# Patient Record
Sex: Female | Born: 1935 | Race: White | Hispanic: No | Smoking: Never smoker
Health system: Southern US, Community
[De-identification: ages and names within clinical notes are randomized; demographics above are authoritative.]

## PROBLEM LIST (undated history)

## (undated) DIAGNOSIS — G8929 Other chronic pain: Secondary | ICD-10-CM

## (undated) DIAGNOSIS — I10 Essential (primary) hypertension: Secondary | ICD-10-CM

## (undated) DIAGNOSIS — C50919 Malignant neoplasm of unspecified site of unspecified female breast: Secondary | ICD-10-CM

## (undated) DIAGNOSIS — M549 Dorsalgia, unspecified: Secondary | ICD-10-CM

## (undated) DIAGNOSIS — E119 Type 2 diabetes mellitus without complications: Secondary | ICD-10-CM

## (undated) DIAGNOSIS — M109 Gout, unspecified: Secondary | ICD-10-CM

## (undated) DIAGNOSIS — Z923 Personal history of irradiation: Secondary | ICD-10-CM

## (undated) DIAGNOSIS — M1990 Unspecified osteoarthritis, unspecified site: Secondary | ICD-10-CM

## (undated) HISTORY — DX: Gout, unspecified: M10.9

## (undated) HISTORY — DX: Type 2 diabetes mellitus without complications: E11.9

## (undated) HISTORY — DX: Malignant neoplasm of unspecified site of unspecified female breast: C50.919

## (undated) HISTORY — DX: Other chronic pain: G89.29

## (undated) HISTORY — PX: ABDOMINAL HYSTERECTOMY: SHX81

## (undated) HISTORY — PX: KNEE ARTHROSCOPY: SUR90

## (undated) HISTORY — PX: BREAST BIOPSY: SHX20

## (undated) HISTORY — DX: Unspecified osteoarthritis, unspecified site: M19.90

## (undated) HISTORY — PX: APPENDECTOMY: SHX54

## (undated) HISTORY — PX: CHOLECYSTECTOMY: SHX55

## (undated) HISTORY — DX: Essential (primary) hypertension: I10

## (undated) HISTORY — DX: Dorsalgia, unspecified: M54.9

---

## 2004-05-12 ENCOUNTER — Ambulatory Visit: Payer: Self-pay | Admitting: Family Medicine

## 2005-09-29 ENCOUNTER — Ambulatory Visit: Payer: Self-pay | Admitting: Family Medicine

## 2006-11-28 ENCOUNTER — Ambulatory Visit: Payer: Self-pay | Admitting: Family Medicine

## 2008-04-30 ENCOUNTER — Ambulatory Visit: Payer: Self-pay | Admitting: Family Medicine

## 2009-09-28 ENCOUNTER — Ambulatory Visit: Payer: Self-pay | Admitting: Family Medicine

## 2010-12-27 ENCOUNTER — Ambulatory Visit: Payer: Self-pay | Admitting: Family Medicine

## 2010-12-27 ENCOUNTER — Ambulatory Visit: Payer: Self-pay

## 2011-09-19 ENCOUNTER — Ambulatory Visit: Payer: Self-pay | Admitting: Family Medicine

## 2011-09-30 ENCOUNTER — Ambulatory Visit: Payer: Self-pay | Admitting: Family Medicine

## 2011-10-30 ENCOUNTER — Ambulatory Visit: Payer: Self-pay | Admitting: Family Medicine

## 2012-06-04 ENCOUNTER — Ambulatory Visit: Payer: Self-pay | Admitting: Family Medicine

## 2012-09-16 ENCOUNTER — Ambulatory Visit: Payer: Self-pay | Admitting: Family Medicine

## 2013-05-01 DIAGNOSIS — Z923 Personal history of irradiation: Secondary | ICD-10-CM

## 2013-05-01 HISTORY — PX: BREAST BIOPSY: SHX20

## 2013-05-01 HISTORY — PX: BREAST LUMPECTOMY: SHX2

## 2013-05-01 HISTORY — DX: Personal history of irradiation: Z92.3

## 2013-11-13 ENCOUNTER — Ambulatory Visit: Payer: Self-pay | Admitting: Pain Medicine

## 2013-11-26 ENCOUNTER — Ambulatory Visit: Payer: Self-pay | Admitting: Family Medicine

## 2013-12-01 ENCOUNTER — Ambulatory Visit: Payer: Self-pay | Admitting: Family Medicine

## 2013-12-01 ENCOUNTER — Ambulatory Visit: Payer: Self-pay | Admitting: Pain Medicine

## 2013-12-08 ENCOUNTER — Ambulatory Visit: Payer: Self-pay | Admitting: Pain Medicine

## 2013-12-09 ENCOUNTER — Ambulatory Visit: Payer: Self-pay | Admitting: Family Medicine

## 2013-12-11 LAB — PATHOLOGY REPORT

## 2013-12-22 ENCOUNTER — Ambulatory Visit: Payer: Self-pay | Admitting: Surgery

## 2013-12-22 LAB — BASIC METABOLIC PANEL
Anion Gap: 6 — ABNORMAL LOW (ref 7–16)
BUN: 37 mg/dL — AB (ref 7–18)
CALCIUM: 9.9 mg/dL (ref 8.5–10.1)
Chloride: 104 mmol/L (ref 98–107)
Co2: 29 mmol/L (ref 21–32)
Creatinine: 1.1 mg/dL (ref 0.60–1.30)
EGFR (African American): 55 — ABNORMAL LOW
EGFR (Non-African Amer.): 47 — ABNORMAL LOW
GLUCOSE: 83 mg/dL (ref 65–99)
Osmolality: 285 (ref 275–301)
POTASSIUM: 4.3 mmol/L (ref 3.5–5.1)
SODIUM: 139 mmol/L (ref 136–145)

## 2013-12-26 ENCOUNTER — Ambulatory Visit: Payer: Self-pay | Admitting: Surgery

## 2014-01-06 ENCOUNTER — Ambulatory Visit: Payer: Self-pay | Admitting: Pain Medicine

## 2014-01-06 LAB — PATHOLOGY REPORT

## 2014-01-09 ENCOUNTER — Ambulatory Visit: Payer: Self-pay | Admitting: Internal Medicine

## 2014-01-13 ENCOUNTER — Ambulatory Visit: Payer: Self-pay | Admitting: Internal Medicine

## 2014-01-14 ENCOUNTER — Ambulatory Visit: Payer: Self-pay | Admitting: Pain Medicine

## 2014-01-29 ENCOUNTER — Ambulatory Visit: Payer: Self-pay | Admitting: Internal Medicine

## 2014-02-05 LAB — CBC CANCER CENTER
BASOS PCT: 0.8 %
Basophil #: 0.1 x10 3/mm (ref 0.0–0.1)
EOS PCT: 1.5 %
Eosinophil #: 0.1 x10 3/mm (ref 0.0–0.7)
HCT: 43.4 % (ref 35.0–47.0)
HGB: 14.4 g/dL (ref 12.0–16.0)
LYMPHS ABS: 2.9 x10 3/mm (ref 1.0–3.6)
Lymphocyte %: 32.6 %
MCH: 28.2 pg (ref 26.0–34.0)
MCHC: 33.1 g/dL (ref 32.0–36.0)
MCV: 85 fL (ref 80–100)
MONOS PCT: 8.2 %
Monocyte #: 0.7 x10 3/mm (ref 0.2–0.9)
NEUTROS PCT: 56.9 %
Neutrophil #: 5.2 x10 3/mm (ref 1.4–6.5)
Platelet: 215 x10 3/mm (ref 150–440)
RBC: 5.09 10*6/uL (ref 3.80–5.20)
RDW: 14.5 % (ref 11.5–14.5)
WBC: 9 x10 3/mm (ref 3.6–11.0)

## 2014-02-10 ENCOUNTER — Ambulatory Visit: Payer: Self-pay | Admitting: Pain Medicine

## 2014-02-12 LAB — CBC CANCER CENTER
Basophil #: 0.1 x10 3/mm (ref 0.0–0.1)
Basophil %: 0.7 %
Eosinophil #: 0.2 x10 3/mm (ref 0.0–0.7)
Eosinophil %: 2 %
HCT: 43.5 % (ref 35.0–47.0)
HGB: 14.3 g/dL (ref 12.0–16.0)
LYMPHS ABS: 2.5 x10 3/mm (ref 1.0–3.6)
Lymphocyte %: 28.5 %
MCH: 28.2 pg (ref 26.0–34.0)
MCHC: 32.8 g/dL (ref 32.0–36.0)
MCV: 86 fL (ref 80–100)
Monocyte #: 0.8 x10 3/mm (ref 0.2–0.9)
Monocyte %: 9.5 %
NEUTROS ABS: 5.1 x10 3/mm (ref 1.4–6.5)
Neutrophil %: 59.3 %
PLATELETS: 225 x10 3/mm (ref 150–440)
RBC: 5.06 10*6/uL (ref 3.80–5.20)
RDW: 15.2 % — AB (ref 11.5–14.5)
WBC: 8.6 x10 3/mm (ref 3.6–11.0)

## 2014-02-18 ENCOUNTER — Ambulatory Visit: Payer: Self-pay | Admitting: Pain Medicine

## 2014-02-19 LAB — CBC CANCER CENTER
BASOS ABS: 0.1 x10 3/mm (ref 0.0–0.1)
Basophil %: 0.7 %
EOS ABS: 0 x10 3/mm (ref 0.0–0.7)
Eosinophil %: 0.1 %
HCT: 42.9 % (ref 35.0–47.0)
HGB: 14.1 g/dL (ref 12.0–16.0)
LYMPHS ABS: 1.7 x10 3/mm (ref 1.0–3.6)
LYMPHS PCT: 11.6 %
MCH: 28.2 pg (ref 26.0–34.0)
MCHC: 32.8 g/dL (ref 32.0–36.0)
MCV: 86 fL (ref 80–100)
MONOS PCT: 6 %
Monocyte #: 0.9 x10 3/mm (ref 0.2–0.9)
NEUTROS PCT: 81.6 %
Neutrophil #: 12.1 x10 3/mm — ABNORMAL HIGH (ref 1.4–6.5)
PLATELETS: 229 x10 3/mm (ref 150–440)
RBC: 4.99 10*6/uL (ref 3.80–5.20)
RDW: 14.9 % — AB (ref 11.5–14.5)
WBC: 14.8 x10 3/mm — ABNORMAL HIGH (ref 3.6–11.0)

## 2014-02-26 LAB — CBC CANCER CENTER
BASOS PCT: 1.1 %
Basophil #: 0.1 x10 3/mm (ref 0.0–0.1)
Eosinophil #: 0.1 x10 3/mm (ref 0.0–0.7)
Eosinophil %: 0.6 %
HCT: 45.5 % (ref 35.0–47.0)
HGB: 14.8 g/dL (ref 12.0–16.0)
Lymphocyte #: 2.4 x10 3/mm (ref 1.0–3.6)
Lymphocyte %: 20.1 %
MCH: 28.2 pg (ref 26.0–34.0)
MCHC: 32.5 g/dL (ref 32.0–36.0)
MCV: 87 fL (ref 80–100)
MONO ABS: 1 x10 3/mm — AB (ref 0.2–0.9)
Monocyte %: 8.2 %
Neutrophil #: 8.3 x10 3/mm — ABNORMAL HIGH (ref 1.4–6.5)
Neutrophil %: 70 %
PLATELETS: 237 x10 3/mm (ref 150–440)
RBC: 5.24 10*6/uL — AB (ref 3.80–5.20)
RDW: 15.5 % — ABNORMAL HIGH (ref 11.5–14.5)
WBC: 11.9 x10 3/mm — ABNORMAL HIGH (ref 3.6–11.0)

## 2014-03-01 ENCOUNTER — Ambulatory Visit: Payer: Self-pay | Admitting: Internal Medicine

## 2014-03-17 ENCOUNTER — Ambulatory Visit: Payer: Self-pay | Admitting: Pain Medicine

## 2014-03-31 ENCOUNTER — Ambulatory Visit: Payer: Self-pay | Admitting: Internal Medicine

## 2014-04-06 ENCOUNTER — Ambulatory Visit: Payer: Self-pay | Admitting: Pain Medicine

## 2014-05-12 ENCOUNTER — Ambulatory Visit: Payer: Self-pay | Admitting: Pain Medicine

## 2014-05-18 ENCOUNTER — Ambulatory Visit: Payer: Self-pay | Admitting: Pain Medicine

## 2014-07-07 ENCOUNTER — Ambulatory Visit
Admit: 2014-07-07 | Disposition: A | Discharge status: Home / Self Care | Payer: Self-pay | Attending: Internal Medicine | Admitting: Internal Medicine

## 2014-07-21 ENCOUNTER — Ambulatory Visit: Payer: Self-pay | Admitting: Pain Medicine

## 2014-07-31 ENCOUNTER — Ambulatory Visit
Admit: 2014-07-31 | Disposition: A | Discharge status: Home / Self Care | Payer: Self-pay | Attending: Internal Medicine | Admitting: Internal Medicine

## 2014-08-03 ENCOUNTER — Ambulatory Visit
Admit: 2014-08-03 | Disposition: A | Discharge status: Home / Self Care | Payer: Self-pay | Attending: Pain Medicine | Admitting: Pain Medicine

## 2014-08-22 NOTE — Consult Note (Signed)
Reason for Visit: This 81 year old Female patient presents to the clinic for initial evaluation of  breast cancer .   Referred by Dr. Tamala Julian.  Diagnosis:  Chief Complaint/Diagnosis   81 year old female with stage I (T1 A. N0 M0) invasive mammary carcinoma of the right breast status post wide local excision with focal positive margin for ductal carcinoma in situ. Tumor is ER/PR positive  Pathology Report pathology report reviewed   Imaging Report mammograms reviewed   Referral Report clinical notes reviewed   Planned Treatment Regimen hypofractionated whole breast radiation plus aromatase inhibitor   HPI   patient is an 81 year old female who presents with an abnormal mammogram of her right breast showing a 9 mm oval mass within the upper inner right breast beneath the nipple Arial her complex. Ultrasound confirmed an 8 x 7 x 6 mm lesion suspicious for malignancy. Ultrasound-guided core biopsy was positive for invasive mammary carcinoma.she underwent a wide local excision showed residual 1 mm of invasive mammary carcinoma. Tumor was ER PR positive grade 1 overall. Margins were clear 2 mm for the invasive component focal positive for ductal carcinoma in situ component. She's been seen by medical oncology and will be placed on aromatase inhibitor after completion of radiation. She is doing well at the present time specifically denies breast tenderness cough or bone pain.  Past Hx:    arthritis:    Arthritis:    Diabetes Mellitus, Type II (NIDD):    Hypertension:    Other, see comments: skin cancer removal from face   Appendectomy:    Cholecystectomy:    Hysterectomy - Total:   Past, Family and Social History:  Past Medical History positive   Cardiovascular hypertension   Endocrine diabetes mellitus   Past Surgical History appendectomy; cholecystectomy; total, hysterectomy   Past Medical History Comments arthritis, gout, chronic back pain receiving pain clinic shots at  this point   Family History positive   Family History Comments other with liver cancer father with coronary vascular disease and stroke paternal aunt with breast cancer   Social History noncontributory   Additional Past Medical and Surgical History accompanied by son and nurse navigator to   Allergies:   Neurontin: Itching  Home Meds:  Home Medications: Medication Instructions Status  CEFTIN  250  MG       1  TAB  PO  BID Active  atenolol 100 mg oral tablet 1 tab(s) orally once a day (at bedtime) Active  CeleBREX 200 mg oral capsule 1 tab(s) orally once a day (at bedtime) Active  hydrochlorothiazide-lisinopril 25 mg-20 mg oral tablet 1 tab(s) orally once a day (at bedtime) Active  metFORMIN 500 mg oral tablet 1 tab(s) orally once a day (at bedtime) Active  NIFEdipine 30 mg oral tablet, extended release 1 tab(s) orally once a day (at bedtime) Active  Norco 325 mg-5 mg oral tablet 1 tab(s) orally 2 times a day, As Needed Active  oxybutynin 5 mg oral tablet 1 tab(s) orally 2 times a day Active  PROzac 20 mg oral capsule 1 cap(s) orally once a day (at bedtime) Active  allopurinol 100 mg oral tablet 1 tab(s) orally once a day (at bedtime) Active  colchicine 0.6 mg oral tablet  orally , As Needed Active  Cormax 0.05% topical solution Apply topically to affected area 2 times a day, As Needed Active   Review of Systems:  General negative   Performance Status (ECOG) 0   Skin negative   Breast see HPI  Ophthalmologic negative   ENMT negative   Respiratory and Thorax negative   Cardiovascular negative   Gastrointestinal negative   Genitourinary negative   Musculoskeletal negative   Neurological negative   Psychiatric negative   Hematology/Lymphatics negative   Endocrine negative   Allergic/Immunologic negative   Review of Systems   except for chronic back pain which is being controlled with by the pain clinic denies any weight loss, fatigue, weakness, fever,  chills or night sweats. Patient denies any loss of vision, blurred vision. Patient denies any ringing  of the ears or hearing loss. No irregular heartbeat. Patient denies heart murmur or history of fainting. Patient denies any chest pain or pain radiating to her upper extremities. Patient denies any shortness of breath, difficulty breathing at night, cough or hemoptysis. Patient denies any swelling in the lower legs. Patient denies any nausea vomiting, vomiting of blood, or coffee ground material in the vomitus. Patient denies any stomach pain. Patient states has had normal bowel movements no significant constipation or diarrhea. Patient denies any dysuria, hematuria or significant nocturia. Patient denies any problems walking, swelling in the joints or loss of balance. Patient denies any skin changes, loss of hair or loss of weight. Patient denies any excessive worrying or anxiety or significant depression. Patient denies any problems with insomnia. Patient denies excessive thirst, polyuria, polydipsia. Patient denies any swollen glands, patient denies easy bruising or easy bleeding. Patient denies any recent infections, allergies or URI. Patient "s visual fields have not changed significantly in recent time.   Nursing Notes:  Nursing Vital Signs and Chemo Nursing Nursing Notes: *CC Vital Signs Flowsheet:   18-Sep-15 09:09  Temp Temperature 96.8  Pulse Pulse 61  Respirations Respirations 21  SBP SBP 138  DBP DBP 81  Current Weight (kg) (kg) 79.5  Height (cm) centimeters 153.2  BSA (m2) 1.7   Physical Exam:  General/Skin/HEENT:  General normal   Skin normal   Eyes normal   ENMT normal   Head and Neck normal   Additional PE well-developed female in NAD. Lungs are clear to A&P cardiac examination shows regular rate and rhythm. Right breast shows a recent wide local excision scar around the right breast nipple Arial her complex. Incision is well-healed. No dominant mass or nodularity is  noted in either breast in 2 positions examined. No axillary or supraclavicular adenopathy is appreciated.   Breasts/Resp/CV/GI/GU:  Respiratory and Thorax normal   Cardiovascular normal   Gastrointestinal normal   Genitourinary normal   MS/Neuro/Psych/Lymph:  Musculoskeletal normal   Neurological normal   Lymphatics normal   Other Results:  Radiology Results: LabUnknown:    03-Aug-15 11:09, Digital Additional Views Rt Breast (SCR)  PACS Image     03-Aug-15 11:22, MAM [place] Right Limited  PACS [place]:    03-Aug-15 11:09, Digital Additional Views Rt Breast (SCR)  Digital Additional Views Rt Breast (SCR)   REASON FOR EXAM:    av rt mass  COMMENTS:       PROCEDURE: MAM - MAM DIG ADDVIEWS RT SCR  - Dec 01 2013 11:09AM     CLINICAL DATA:  Patient recalled from screening for right breast  mass.    EXAM:  DIGITAL DIAGNOSTIC  RIGHT MAMMOGRAM WITH CAD    ULTRASOUND RIGHT BREAST    COMPARISON:  [facility]  ACR Breast Density Category b: There are scattered areas of  fibroglandular density.    FINDINGS:  Spot compression CC and MLO views demonstrate an obscured  9 mm oval  mass within the upper inner right breast middle depth.    Mammographic images were processed with CAD.    On physical exam, I palpate no discrete mass within the upper-inner  right breast.    Ultrasound is performed, showing a 8 x 7 x 6 mm taller than wide  oval mass with indistinct margins within the right breast 130  o'clock 2 cm from the nipple. No right axillary adenopathy.   IMPRESSION:  Suspicious right breast mass.    RECOMMENDATION:  Ultrasound-guided core needle biopsy.    This will be scheduled at the patient's convenience.    I have discussed the findings and recommendations with the patient.  Results were also provided in writing at the conclusion of the  visit. If applicable, a reminder letter will be sent to the patient  regarding the next appointment.    BI-RADS  CATEGORY  4: Suspicious.  Electronically Signed    By: Lovey Newcomer M.D.    On: 12/01/2013 11:27         Verified By: Ilsa Iha, M.D.,   Relevent Results:   Relevant Scans and Labs mammograms and ultrasound reviewed   Assessment and Plan: Impression:   stage I invasive mammary carcinoma right breast status post wide local excision in 81 year old female tumor is ER/PR positive for adjuvant whole breast radiation plus or an aromataseinhibitor Plan:   at this time I recommend whole breast radiation to her right breast. I would like you to hypofractionated course of treatment over 16 fractions. Also posterior scar based on the close margin another 1600 cGy using electron beam. Risks and benefits of treatment including skin reaction, fatigue, inclusion of possible superficial lung, alteration of blood counts, all were explained in detail to the patient and her son. I have set her up and ordered CT simulation for early next week. Patient will also be a candidate for aromatase inhibitor therapy after completion of radiation.  I would like to take this opportunity for allowing me to participate in the care of your patient..  Fax to Physician:  Physicians To Recieve Fax: Earlene Plater, MD - [phone] Rochel Brome - [phone].  Electronic Signatures: Jameya Pontiff, Roda Shutters (MD)  (Signed 18-Sep-15 11:37)  Authored: HPI, Diagnosis, Past Hx, PFSH, Allergies, Home Meds, ROS, Nursing Notes, Physical Exam, Other Results, Relevent Results, Encounter Assessment and Plan, Fax to Physician   Last Updated: 18-Sep-15 11:37 by Armstead Peaks (MD)

## 2014-08-22 NOTE — Op Note (Signed)
PATIENT NAME:  [name], Isabella Cochran MR#:  [id number] DATE OF BIRTH:  June 01, 1930  DATE OF PROCEDURE:  12/26/2013  PREOPERATIVE DIAGNOSIS: Right breast mass.   POSTOPERATIVE DIAGNOSIS: Right breast mass.   PROCEDURE: Excision, right breast mass.   SURGEON: Rochel Brome, M.D.   ANESTHESIA: General.   INDICATIONS: This 81 year old female recently had a mammogram depicting a density in the upper inner quadrant of the right breast. Ultrasound demonstrated an 8 mm nodule. Needle biopsy demonstrated atypical papillary neoplasm with sclerosis. Excision of the mass was recommended for further evaluation.   DESCRIPTION OF PROCEDURE: The patient was placed on the operating table in the supine position under general anesthesia. The dressing was removed from the upper inner quadrant of the right breast, exposing the Kopans wire which entered the breast several centimeters from the border of the areola. Mammogram images were viewed, seeing the location of the thick portion of the wire adjacent to the nodule and the biopsy marker. The wire was cut 2 cm from the skin. The breast was prepared with ChloraPrep, draped in a sterile manner.   A curvilinear incision was made from approximately 11:30 to the 2 o'clock position of the left breast, just about 5 mm outside the border of the areola, carried down through subcutaneous tissues. Several small bleeding points were cauterized. The wire was encountered. There was 1 arterial bleeding point which was suture ligated with 4-0 chromic and resolved. The mass was excised by removing tissue surrounding the thick portion of the wire, extending down as far as the barb. The inferior portion of the specimen was tagged with a 3-0 nylon stitch for the pathologist's orientation, and also the barb was at the deep portion of the excision. The wire was left intact and was submitted for specimen mammogram and routine pathology. The wound was inspected. There was no visible remaining mass  within the wound. No palpable mass. Several small bleeding points were cauterized. Tissues surrounding cautery artifact were infiltrated with 0.5% Sensorcaine with epinephrine. Subcutaneous tissues were infiltrated as well. Subcutaneous tissues were approximated with 4-0 chromic. The skin was closed with running 4-0 Monocryl subcuticular suture and Dermabond. The patient tolerated surgery satisfactorily and was then prepared for transfer to the recovery room.   ____________________________ Lenna Sciara. Rochel Brome, MD jws:gb D: 12/26/2013 13:27:37 ET T: 12/26/2013 23:00:15 ET JOB#: [id number]  cc: Loreli Dollar, MD, <Dictator> Loreli Dollar MD ELECTRONICALLY SIGNED 12/31/2013 18:23

## 2014-08-26 ENCOUNTER — Ambulatory Visit
Admit: 2014-08-26 | Disposition: A | Discharge status: Home / Self Care | Payer: Self-pay | Attending: Pain Medicine | Admitting: Pain Medicine

## 2014-09-01 ENCOUNTER — Ambulatory Visit: Payer: Self-pay | Admitting: Pain Medicine

## 2014-09-06 DIAGNOSIS — M5416 Radiculopathy, lumbar region: Secondary | ICD-10-CM | POA: Insufficient documentation

## 2014-09-06 DIAGNOSIS — M48061 Spinal stenosis, lumbar region without neurogenic claudication: Secondary | ICD-10-CM | POA: Insufficient documentation

## 2014-09-06 DIAGNOSIS — M5137 Other intervertebral disc degeneration, lumbosacral region: Secondary | ICD-10-CM | POA: Insufficient documentation

## 2014-09-06 DIAGNOSIS — M51379 Other intervertebral disc degeneration, lumbosacral region without mention of lumbar back pain or lower extremity pain: Secondary | ICD-10-CM | POA: Insufficient documentation

## 2014-09-06 NOTE — Patient Instructions (Addendum)
Continue present medications and take antibiotic.   F/U PCP for evaluation of BP and general medical condition.  F/U neurological evaluation.  .F/U surgical evaluation.   May consider radiofrequency rhizolysis, intraspinal implantation, and other procedures  Patient to call Pain Management Center for any concerns prior to scheduled appointment.  Patient is to call Pain Management Center should the patient have concerns prior to return appointmen Pain Management Discharge Instructions  General Discharge Instructions :  If you need to reach your doctor call: Monday-Friday 8:00 am - 4:00 pm at [phone] or toll free [phone].  After clinic hours [phone] to have operator reach doctor.  Bring all of your medication bottles to all your appointments in the pain clinic.  To cancel or reschedule your appointment with Pain Management please remember to call 24 hours in advance to avoid a fee.  Refer to the educational materials which you have been given on: General Risks, I had my Procedure. Discharge Instructions, Post Sedation.  Post Procedure Instructions:  The drugs you were given will stay in your system until tomorrow, so for the next 24 hours you should not drive, make any legal decisions or drink any alcoholic beverages.  You may eat anything you prefer, but it is better to start with liquids then soups and crackers, and gradually work up to solid foods.  Please notify your doctor immediately if you have any unusual bleeding, trouble breathing or pain that is not related to your normal pain.  Depending on the type of procedure that was done, some parts of your body may feel week and/or numb.  This usually clears up by tonight or the next day.  Walk with the use of an assistive device or accompanied by an adult for the 24 hours.  You may use ice on the affected area for the first 24 hours.  Put ice in a Ziploc bag and cover with a towel and place against area 15 minutes  on 15 minutes off.  You may switch to heat after 24 hours.

## 2014-09-07 ENCOUNTER — Ambulatory Visit: Payer: Medicare Other | Attending: Pain Medicine | Admitting: Pain Medicine

## 2014-09-07 ENCOUNTER — Encounter: Payer: Self-pay | Admitting: Pain Medicine

## 2014-09-07 VITALS — BP 111/35 | HR 64 | Temp 97.7°F | Resp 16 | Ht 60.0 in | Wt 169.0 lb

## 2014-09-07 DIAGNOSIS — M5137 Other intervertebral disc degeneration, lumbosacral region: Secondary | ICD-10-CM | POA: Insufficient documentation

## 2014-09-07 DIAGNOSIS — M4806 Spinal stenosis, lumbar region: Secondary | ICD-10-CM | POA: Diagnosis not present

## 2014-09-07 DIAGNOSIS — M5416 Radiculopathy, lumbar region: Secondary | ICD-10-CM | POA: Diagnosis not present

## 2014-09-07 DIAGNOSIS — M48061 Spinal stenosis, lumbar region without neurogenic claudication: Secondary | ICD-10-CM

## 2014-09-07 MED ORDER — FENTANYL CITRATE (PF) 100 MCG/2ML IJ SOLN
INTRAMUSCULAR | Status: AC
  Administered 2014-09-07: 50 ug via INTRAVENOUS
  Filled 2014-09-07: qty 2

## 2014-09-07 MED ORDER — BUPIVACAINE HCL (PF) 0.25 % IJ SOLN
INTRAMUSCULAR | Status: AC
  Filled 2014-09-07: qty 30

## 2014-09-07 MED ORDER — CEFUROXIME AXETIL 250 MG PO TABS
250.0000 mg | ORAL_TABLET | Freq: Two times a day (BID) | ORAL | Status: DC
Start: 2014-09-07 — End: 2015-11-04

## 2014-09-07 MED ORDER — CEFAZOLIN SODIUM 1-5 GM-% IV SOLN
1.0000 g | Freq: Once | INTRAVENOUS | Status: AC
  Filled 2014-09-07: qty 50

## 2014-09-07 MED ORDER — LIDOCAINE HCL (PF) 1 % IJ SOLN
INTRAMUSCULAR | Status: AC
  Administered 2014-09-07: 15:00:00
  Filled 2014-09-07: qty 5

## 2014-09-07 MED ORDER — SODIUM CHLORIDE 0.9 % IJ SOLN
INTRAMUSCULAR | Status: AC
  Administered 2014-09-07: 15:00:00
  Filled 2014-09-07: qty 20

## 2014-09-07 MED ORDER — TRIAMCINOLONE ACETONIDE 40 MG/ML IJ SUSP
INTRAMUSCULAR | Status: AC
  Administered 2014-09-07: 40 mg via EPIDURAL
  Filled 2014-09-07: qty 1

## 2014-09-07 MED ORDER — ORPHENADRINE CITRATE 30 MG/ML IJ SOLN
INTRAMUSCULAR | Status: AC
  Filled 2014-09-07: qty 2

## 2014-09-07 MED ORDER — MIDAZOLAM HCL 5 MG/5ML IJ SOLN
INTRAMUSCULAR | Status: AC
  Administered 2014-09-07: 2 mg via INTRAVENOUS
  Filled 2014-09-07: qty 5

## 2014-09-07 MED ORDER — CEFAZOLIN SODIUM 1 G IJ SOLR
INTRAMUSCULAR | Status: AC
  Administered 2014-09-07: 1 g via INTRAVENOUS
  Filled 2014-09-07: qty 10

## 2014-09-07 NOTE — Progress Notes (Signed)
Last time eat or drink last night. Took 2 meds (nifedipine and arimidex) this am with a sip of water. Driver is available upon calling. Is not on any blood thinners.

## 2014-09-07 NOTE — Progress Notes (Signed)
Patient is a 81 year old female returns to pain management for further evaluation and treatment of lower back and lower extremity pain with MRI evidence of degenerative changes of the lumbar spine wishes to proceed with interventional treatment in attempt to decrease severity of patient's symptoms and hopefully retard progression of symptoms. The risks benefits and expectations of procedure discussed and explained to patient who is understanding and wishes to proceed with procedure

## 2014-09-07 NOTE — Procedures (Signed)
PROCEDURE PERFORMED: Lumbar epidural steroid injection   NOTE: The patient is a 81 y.o.-year-old female who returns to [place] for further evaluation and treatment of pain involving the lumbar and lower extremity region. MRI studies have revealed the patient to be with **. The risks, benefits, and expectations of the procedure have been discussed and explained to the patient who was understanding and in agreement with suggested treatment plan. We will proceed with interventional treatment as discussed and explained to the patient who is willing to proceed with procedure as planned.   DESCRIPTION OF PROCEDURE: Lumbar epidural steroid injection with IV Versed, IV fentanyl conscious sedation, EKG, blood pressure, pulse, and pulse oximetry monitoring. The procedure was performed with the patient in the prone position under fluoroscopic guidance. A local anesthetic skin wheal of 1.5% plain lidocaine was accomplished at proposed entry site. An 18-gauge Tuohy epidural needle was inserted at the L 3 vertebral body level left  of the midline via loss-of-resistance technique with negative heme and negative CSF return. A total of 4 mL of Preservative-Free normal saline with 40 mg of Kenalog injected incrementally via epidurally placed needle. Needle removed. The patient tolerated the injection well.   PLAN:   1. Medications: We will continue presently prescribed medications. 2. Will consider modification of treatment regimen pending response to treatment rendered on today's visit and follow-up evaluation. 3. The patient is to follow-up with primary care physician regarding blood pressure and general medical condition status post lumbar epidural steroid injection performed on today's visit. 4. Surgical evaluation. 5. Neurological evaluation. 6. The patient may be a candidate for radiofrequency procedures, implantation device, and other treatment pending response to treatment and follow-up  evaluation. 7. The patient has been advised to adhere to proper body mechanics and avoid activities which appear to aggravate condition. 8. The patient has been advised to call the Pain Management Center prior to scheduled return appointment should there be significant change in condition or should there be significant  1. Medications: We will continue presently prescribed medications.  2. Will consider modification of treatment regimen pending response to treatment rendered on today's visit and follow-up evaluation.  3. The patient is to follow-up with primary care physician regarding blood pressure and general medical condition status post lumbar epidural steroid injection performed on today's visit.  4. Surgical evaluation.  5. Neurological evaluation. 6. The patient may be a candidate for radiofrequency procedures, implantation device, and other treatment pending response to treatment and follow-up evaluation.  7. The patient has been advised to adhere to proper body mechanics and avoid activities which appear to aggravate condition.  8. The patient has been advised to call the Pain Management Center prior to scheduled return appointment should there be significant change in condition or should should patient have other concerns regarding condition prior to scheduled return appointment.  The patient is understanding and in agreement with suggested treatment plan.left

## 2014-09-07 NOTE — Progress Notes (Signed)
Discharge patient home via wheelchair at  [facility]. Teach back 3 done

## 2014-09-08 ENCOUNTER — Telehealth: Payer: Self-pay | Admitting: *Deleted

## 2014-09-08 NOTE — Telephone Encounter (Signed)
No problems post procedure. 

## 2014-09-21 ENCOUNTER — Ambulatory Visit: Payer: Self-pay | Attending: Radiation Oncology | Admitting: Radiation Oncology

## 2014-10-04 ENCOUNTER — Other Ambulatory Visit: Payer: Self-pay | Admitting: Pain Medicine

## 2014-10-04 DIAGNOSIS — M5416 Radiculopathy, lumbar region: Secondary | ICD-10-CM

## 2014-10-04 DIAGNOSIS — M48061 Spinal stenosis, lumbar region without neurogenic claudication: Secondary | ICD-10-CM

## 2014-10-04 DIAGNOSIS — M5137 Other intervertebral disc degeneration, lumbosacral region: Secondary | ICD-10-CM

## 2014-10-04 DIAGNOSIS — M48062 Spinal stenosis, lumbar region with neurogenic claudication: Secondary | ICD-10-CM | POA: Insufficient documentation

## 2014-10-08 ENCOUNTER — Ambulatory Visit: Payer: Medicare Other | Admitting: Pain Medicine

## 2014-10-19 ENCOUNTER — Encounter: Payer: Self-pay | Admitting: Pain Medicine

## 2014-10-19 ENCOUNTER — Ambulatory Visit: Payer: Medicare Other | Attending: Pain Medicine | Admitting: Pain Medicine

## 2014-10-19 VITALS — BP 105/74 | HR 65 | Temp 98.3°F | Resp 18 | Ht 60.0 in | Wt 170.0 lb

## 2014-10-19 DIAGNOSIS — M5416 Radiculopathy, lumbar region: Secondary | ICD-10-CM

## 2014-10-19 DIAGNOSIS — M48061 Spinal stenosis, lumbar region without neurogenic claudication: Secondary | ICD-10-CM

## 2014-10-19 DIAGNOSIS — M1288 Other specific arthropathies, not elsewhere classified, other specified site: Secondary | ICD-10-CM | POA: Diagnosis not present

## 2014-10-19 DIAGNOSIS — M79604 Pain in right leg: Secondary | ICD-10-CM | POA: Diagnosis present

## 2014-10-19 DIAGNOSIS — M47816 Spondylosis without myelopathy or radiculopathy, lumbar region: Secondary | ICD-10-CM | POA: Insufficient documentation

## 2014-10-19 DIAGNOSIS — M5136 Other intervertebral disc degeneration, lumbar region: Secondary | ICD-10-CM | POA: Diagnosis not present

## 2014-10-19 DIAGNOSIS — M79605 Pain in left leg: Secondary | ICD-10-CM | POA: Diagnosis present

## 2014-10-19 DIAGNOSIS — M4316 Spondylolisthesis, lumbar region: Secondary | ICD-10-CM | POA: Diagnosis not present

## 2014-10-19 DIAGNOSIS — M48062 Spinal stenosis, lumbar region with neurogenic claudication: Secondary | ICD-10-CM

## 2014-10-19 DIAGNOSIS — M533 Sacrococcygeal disorders, not elsewhere classified: Secondary | ICD-10-CM | POA: Insufficient documentation

## 2014-10-19 DIAGNOSIS — M51379 Other intervertebral disc degeneration, lumbosacral region without mention of lumbar back pain or lower extremity pain: Secondary | ICD-10-CM

## 2014-10-19 DIAGNOSIS — M5137 Other intervertebral disc degeneration, lumbosacral region: Secondary | ICD-10-CM

## 2014-10-19 DIAGNOSIS — M545 Low back pain: Secondary | ICD-10-CM | POA: Diagnosis present

## 2014-10-19 NOTE — Progress Notes (Signed)
teachback times 3  No scripts  Discharged to home

## 2014-10-19 NOTE — Progress Notes (Signed)
Safety precautions to be maintained throughout the outpatient stay will include: orient to surroundings, keep bed in low position, maintain call bell within reach at all times, provide assistance with transfer out of bed and ambulation.  

## 2014-10-19 NOTE — Patient Instructions (Addendum)
Continue present medications.  Lumbar epidural steroid injection Monday, 10/26/2014  F/U PCP for evaliation of  BP and general medical  condition.  F/U surgical evaluation.  F/U neurological evaluation.  May consider radiofrequency rhizolysis or intraspinal procedures pending response to present treatment and F/U evaluation.  Patient to call [facility] should patient have concerns prior to scheduled return appointment. Epidural Steroid Injection Patient Information  Description: The epidural space surrounds the nerves as they exit the spinal cord.  In some patients, the nerves can be compressed and inflamed by a bulging disc or a tight spinal canal (spinal stenosis).  By injecting steroids into the epidural space, we can bring irritated nerves into direct contact with a potentially helpful medication.  These steroids act directly on the irritated nerves and can reduce swelling and inflammation which often leads to decreased pain.  Epidural steroids may be injected anywhere along the spine and from the neck to the low back depending upon the location of your pain.   After numbing the skin with local anesthetic (like Novocaine), a small needle is passed into the epidural space slowly.  You may experience a sensation of pressure while this is being done.  The entire block usually last less than 10 minutes.  Conditions which may be treated by epidural steroids:   Low back and leg pain  Neck and arm pain  Spinal stenosis  Post-laminectomy syndrome  Herpes zoster (shingles) pain  Pain from compression fractures  Preparation for the injection:  1. Do not eat any solid food or dairy products within 6 hours of your appointment.  2. You may drink clear liquids up to 2 hours before appointment.  Clear liquids include water, black coffee, juice or soda.  No milk or cream please. 3. You may take your regular medication, including pain medications, with a sip of water before your  appointment  Diabetics should hold regular insulin (if taken separately) and take 1/2 normal NPH dos the morning of the procedure.  Carry some sugar containing items with you to your appointment. 4. A driver must accompany you and be prepared to drive you home after your procedure.  5. Bring all your current medications with your. 6. An IV may be inserted and sedation may be given at the discretion of the physician.   7. A blood pressure cuff, EKG and other monitors will often be applied during the procedure.  Some patients may need to have extra oxygen administered for a short period. 8. You will be asked to provide medical information, including your allergies, prior to the procedure.  We must know immediately if you are taking blood thinners (like Coumadin/Warfarin)  Or if you are allergic to IV iodine contrast (dye). We must know if you could possible be pregnant.  Possible side-effects:  Bleeding from needle site  Infection (rare, may require surgery)  Nerve injury (rare)  Numbness & tingling (temporary)  Difficulty urinating (rare, temporary)  Spinal headache ( a headache worse with upright posture)  Light -headedness (temporary)  Pain at injection site (several days)  Decreased blood pressure (temporary)  Weakness in arm/leg (temporary)  Pressure sensation in back/neck (temporary)  Call if you experience:  Fever/chills associated with headache or increased back/neck pain.  Headache worsened by an upright position.  New onset weakness or numbness of an extremity below the injection site  Hives or difficulty breathing (go to the emergency room)  Inflammation or drainage at the infection site  Severe back/neck pain  Any new  symptoms which are concerning to you  Please note:  Although the local anesthetic injected can often make your back or neck feel good for several hours after the injection, the pain will likely return.  It takes 3-7 days for steroids to work in  the epidural space.  You may not notice any pain relief for at least that one week.  If effective, we will often do a series of three injections spaced 3-6 weeks apart to maximally decrease your pain.  After the initial series, we generally will wait several months before considering a repeat injection of the same type.  If you have any questions, please call [phone] [facility] Information  Description: The epidural space surrounds the nerves as they exit the spinal cord.  In some patients, the nerves can be compressed and inflamed by a bulging disc or a tight spinal canal (spinal stenosis).  By injecting steroids into the epidural space, we can bring irritated nerves into direct contact with a potentially helpful medication.  These steroids act directly on the irritated nerves and can reduce swelling and inflammation which often leads to decreased pain.  Epidural steroids may be injected anywhere along the spine and from the neck to the low back depending upon the location of your pain.   After numbing the skin with local anesthetic (like Novocaine), a small needle is passed into the epidural space slowly.  You may experience a sensation of pressure while this is being done.  The entire block usually last less than 10 minutes.  Conditions which may be treated by epidural steroids:   Low back and leg pain  Neck and arm pain  Spinal stenosis  Post-laminectomy syndrome  Herpes zoster (shingles) pain  Pain from compression fractures  Preparation for the injection:  9. Do not eat any solid food or dairy products within 6 hours of your appointment.  10. You may drink clear liquids up to 2 hours before appointment.  Clear liquids include water, black coffee, juice or soda.  No milk or cream please. 11. You may take your regular medication, including pain medications, with a sip of water before your appointment   Diabetics should hold regular insulin (if taken separately) and take 1/2 normal NPH dos the morning of the procedure.  Carry some sugar containing items with you to your appointment. 12. A driver must accompany you and be prepared to drive you home after your procedure.  71. Bring all your current medications with your. 14. An IV may be inserted and sedation may be given at the discretion of the physician.   15. A blood pressure cuff, EKG and other monitors will often be applied during the procedure.  Some patients may need to have extra oxygen administered for a short period. 80. You will be asked to provide medical information, including your allergies, prior to the procedure.  We must know immediately if you are taking blood thinners (like Coumadin/Warfarin)  Or if you are allergic to IV iodine contrast (dye). We must know if you could possible be pregnant.  Possible side-effects:  Bleeding from needle site  Infection (rare, may require surgery)  Nerve injury (rare)  Numbness & tingling (temporary)  Difficulty urinating (rare, temporary)  Spinal headache ( a headache worse with upright posture)  Light -headedness (temporary)  Pain at injection site (several days)  Decreased blood pressure (temporary)  Weakness in arm/leg (temporary)  Pressure sensation in back/neck (temporary)  Call if  you experience:  Fever/chills associated with headache or increased back/neck pain.  Headache worsened by an upright position.  New onset weakness or numbness of an extremity below the injection site  Hives or difficulty breathing (go to the emergency room)  Inflammation or drainage at the infection site  Severe back/neck pain  Any new symptoms which are concerning to you  Please note:  Although the local anesthetic injected can often make your back or neck feel good for several hours after the injection, the pain will likely return.  It takes 3-7 days for steroids to work in the  epidural space.  You may not notice any pain relief for at least that one week.  If effective, we will often do a series of three injections spaced 3-6 weeks apart to maximally decrease your pain.  After the initial series, we generally will wait several months before considering a repeat injection of the same type.  If you have any questions, please call [phone] [facility]

## 2014-10-19 NOTE — Progress Notes (Signed)
   Subjective:    Patient ID: Isabella Cochran, female    DOB: 05-04-1930, 81 y.o.   MRN: [id number]  HPI  Patient is a 81 year old female who returns to [place] for further evaluation and treatment of pain involving the lower back and lower extremity regions. Patient has noted improvement of her pain with interventional treatment performed in [facility] states that she would like to proceed with procedure in attempt to see if she can decrease her symptoms even more. We discussed patient's condition patient prefers to avoid considering surgical intervention we will proceed with lumbar epidural steroid injection at time return appointment in attempt to decrease severity of patient's symptoms, minimize progression of patient's symptoms, and hopefully avoid the need for more involved treatment. The patient was understanding and agreement with suggested treatment plan.     Review of Systems     Objective:   Physical Exam  There was tenderness of the splenius capitis and occipitalis musculature region of mild degree. Mild tenderness of the cervical and thoracic facet regions. There was mild tenderness of the acromioclavicular and glenohumeral joint region. Patient appeared to be with bilaterally equal grip strength. Phalen's maneuver were without increase of pain of any significant degree. Over the thoracic facet thoracic paraspinal musculature region was with evidence of muscle spasm in the lower thoracic region. No crepitus of the thoracic region was noted. Palpation over the lumbar paraspinal musculature region lumbar facet region associated with moderate discomfort. Palpation over the PSIS and PII S region reproduced moderate discomfort. With mild tends to palpation over the greater trochanteric region iliotibial band region. Straight leg raising was tolerates approximately 30 without an increase of pain with dorsiflexion noted. There was negative clonus negative Homans.  Abdomen nontender and no costovertebral tenderness noted.      Assessment & Plan:  Degenerative disc disease lumbar spine Multilevel degenerative changes with anterior listhesis L4-L5 level with facet arthropathy with L2-L3, L4-L5 advanced lumbar spondylosis, change worse at L2-3, with bilateral facet arthropathy is concern regarding lumbar degenerative changes with neurogenic claudication contributing to patient's symptomatology of significant degree   Lumbar facet syndrome  Sacroiliac joint dysfunction     Plan   Continue present medications.  Lumbar epidural steroid injection to be performed at time of return appointment  F/U PCP for evaliation of  BP and general medical  condition.  F/U surgical evaluation.  F/U neurological evaluation.  May consider radiofrequency rhizolysis or intraspinal procedures pending response to present treatment and F/U evaluation.  Patient to call [facility] should patient have concerns prior to scheduled return appointment.

## 2014-11-09 ENCOUNTER — Ambulatory Visit: Payer: Medicare Other | Attending: Pain Medicine | Admitting: Pain Medicine

## 2014-11-09 VITALS — BP 117/63 | HR 59 | Temp 98.2°F | Resp 16 | Ht 60.0 in | Wt 170.0 lb

## 2014-11-09 DIAGNOSIS — M545 Low back pain: Secondary | ICD-10-CM | POA: Diagnosis present

## 2014-11-09 DIAGNOSIS — M1288 Other specific arthropathies, not elsewhere classified, other specified site: Secondary | ICD-10-CM | POA: Diagnosis not present

## 2014-11-09 DIAGNOSIS — M5137 Other intervertebral disc degeneration, lumbosacral region: Secondary | ICD-10-CM

## 2014-11-09 DIAGNOSIS — M4316 Spondylolisthesis, lumbar region: Secondary | ICD-10-CM | POA: Diagnosis not present

## 2014-11-09 DIAGNOSIS — M48061 Spinal stenosis, lumbar region without neurogenic claudication: Secondary | ICD-10-CM

## 2014-11-09 DIAGNOSIS — M47816 Spondylosis without myelopathy or radiculopathy, lumbar region: Secondary | ICD-10-CM | POA: Insufficient documentation

## 2014-11-09 DIAGNOSIS — M79605 Pain in left leg: Secondary | ICD-10-CM | POA: Diagnosis present

## 2014-11-09 DIAGNOSIS — M79604 Pain in right leg: Secondary | ICD-10-CM | POA: Diagnosis present

## 2014-11-09 DIAGNOSIS — M48062 Spinal stenosis, lumbar region with neurogenic claudication: Secondary | ICD-10-CM

## 2014-11-09 DIAGNOSIS — M51379 Other intervertebral disc degeneration, lumbosacral region without mention of lumbar back pain or lower extremity pain: Secondary | ICD-10-CM

## 2014-11-09 DIAGNOSIS — M5416 Radiculopathy, lumbar region: Secondary | ICD-10-CM

## 2014-11-09 MED ORDER — LIDOCAINE HCL (PF) 1 % IJ SOLN
INTRAMUSCULAR | Status: AC
Start: 2014-11-09 — End: 2014-11-09
  Administered 2014-11-09: 11:00:00
  Filled 2014-11-09: qty 5

## 2014-11-09 MED ORDER — SODIUM CHLORIDE 0.9 % IJ SOLN
INTRAMUSCULAR | Status: AC
  Administered 2014-11-09: 11:00:00
  Filled 2014-11-09: qty 20

## 2014-11-09 MED ORDER — MIDAZOLAM HCL 5 MG/5ML IJ SOLN
INTRAMUSCULAR | Status: AC
Start: 2014-11-09 — End: 2014-11-09
  Administered 2014-11-09: 2 mg via INTRAVENOUS
  Filled 2014-11-09: qty 5

## 2014-11-09 MED ORDER — BUPIVACAINE HCL (PF) 0.25 % IJ SOLN
INTRAMUSCULAR | Status: AC
  Filled 2014-11-09: qty 30

## 2014-11-09 MED ORDER — TRIAMCINOLONE ACETONIDE 40 MG/ML IJ SUSP
INTRAMUSCULAR | Status: AC
  Administered 2014-11-09: 11:00:00
  Filled 2014-11-09: qty 1

## 2014-11-09 MED ORDER — ORPHENADRINE CITRATE 30 MG/ML IJ SOLN
INTRAMUSCULAR | Status: AC
  Filled 2014-11-09: qty 2

## 2014-11-09 MED ORDER — FENTANYL CITRATE (PF) 100 MCG/2ML IJ SOLN
INTRAMUSCULAR | Status: AC
  Administered 2014-11-09: 50 ug via INTRAVENOUS
  Filled 2014-11-09: qty 2

## 2014-11-09 NOTE — Progress Notes (Signed)
Safety precautions to be maintained throughout the outpatient stay will include: orient to surroundings, keep bed in low position, maintain call bell within reach at all times, provide assistance with transfer out of bed and ambulation.  

## 2014-11-09 NOTE — Patient Instructions (Addendum)
Continue present medications  F/U PCP Dr. Iona Beard evaliation of  BP and general medical  condition.  F/U surgical evaluation  F/U neurological evaluation  May consider radiofrequency rhizolysis or intraspinal procedures pending response to present treatment and F/U evaluation.  Patient to call [facility] should patient have concerns prior to scheduled return appointment. Pain Management Discharge Instructions  General Discharge Instructions :  If you need to reach your doctor call: Monday-Friday 8:00 am - 4:00 pm at [phone] or toll free [phone].  After clinic hours [phone] to have operator reach doctor.  Bring all of your medication bottles to all your appointments in the pain clinic.  To cancel or reschedule your appointment with Pain Management please remember to call 24 hours in advance to avoid a fee.  Refer to the educational materials which you have been given on: General Risks, I had my Procedure. Discharge Instructions, Post Sedation.  Post Procedure Instructions:  The drugs you were given will stay in your system until tomorrow, so for the next 24 hours you should not drive, make any legal decisions or drink any alcoholic beverages.  You may eat anything you prefer, but it is better to start with liquids then soups and crackers, and gradually work up to solid foods.  Please notify your doctor immediately if you have any unusual bleeding, trouble breathing or pain that is not related to your normal pain.  Depending on the type of procedure that was done, some parts of your body may feel week and/or numb.  This usually clears up by tonight or the next day.  Walk with the use of an assistive device or accompanied by an adult for the 24 hours.  You may use ice on the affected area for the first 24 hours.  Put ice in a Ziploc bag and cover with a towel and place against area 15 minutes on 15 minutes off.  You may switch to heat after 24 hours.Epidural  Steroid Injection Patient Information  Description: The epidural space surrounds the nerves as they exit the spinal cord.  In some patients, the nerves can be compressed and inflamed by a bulging disc or a tight spinal canal (spinal stenosis).  By injecting steroids into the epidural space, we can bring irritated nerves into direct contact with a potentially helpful medication.  These steroids act directly on the irritated nerves and can reduce swelling and inflammation which often leads to decreased pain.  Epidural steroids may be injected anywhere along the spine and from the neck to the low back depending upon the location of your pain.   After numbing the skin with local anesthetic (like Novocaine), a small needle is passed into the epidural space slowly.  You may experience a sensation of pressure while this is being done.  The entire block usually last less than 10 minutes.  Conditions which may be treated by epidural steroids:   Low back and leg pain  Neck and arm pain  Spinal stenosis  Post-laminectomy syndrome  Herpes zoster (shingles) pain  Pain from compression fractures  Preparation for the injection:  1. Do not eat any solid food or dairy products within 6 hours of your appointment.  2. You may drink clear liquids up to 2 hours before appointment.  Clear liquids include water, black coffee, juice or soda.  No milk or cream please. 3. You may take your regular medication, including pain medications, with a sip of water before your appointment  Diabetics should hold regular insulin (  if taken separately) and take 1/2 normal NPH dos the morning of the procedure.  Carry some sugar containing items with you to your appointment. 4. A driver must accompany you and be prepared to drive you home after your procedure.  5. Bring all your current medications with your. 6. An IV may be inserted and sedation may be given at the discretion of the physician.   7. A blood pressure cuff, EKG  and other monitors will often be applied during the procedure.  Some patients may need to have extra oxygen administered for a short period. 8. You will be asked to provide medical information, including your allergies, prior to the procedure.  We must know immediately if you are taking blood thinners (like Coumadin/Warfarin)  Or if you are allergic to IV iodine contrast (dye). We must know if you could possible be pregnant.  Possible side-effects:  Bleeding from needle site  Infection (rare, may require surgery)  Nerve injury (rare)  Numbness & tingling (temporary)  Difficulty urinating (rare, temporary)  Spinal headache ( a headache worse with upright posture)  Light -headedness (temporary)  Pain at injection site (several days)  Decreased blood pressure (temporary)  Weakness in arm/leg (temporary)  Pressure sensation in back/neck (temporary)  Call if you experience:  Fever/chills associated with headache or increased back/neck pain.  Headache worsened by an upright position.  New onset weakness or numbness of an extremity below the injection site  Hives or difficulty breathing (go to the emergency room)  Inflammation or drainage at the infection site  Severe back/neck pain  Any new symptoms which are concerning to you  Please note:  Although the local anesthetic injected can often make your back or neck feel good for several hours after the injection, the pain will likely return.  It takes 3-7 days for steroids to work in the epidural space.  You may not notice any pain relief for at least that one week.  If effective, we will often do a series of three injections spaced 3-6 weeks apart to maximally decrease your pain.  After the initial series, we generally will wait several months before considering a repeat injection of the same type.  If you have any questions, please call [phone] [facility]

## 2014-11-09 NOTE — Progress Notes (Signed)
   Subjective:    Patient ID: Isabella Cochran, female    DOB: June 07, 1930, 81 y.o.   MRN: [id number]  HPI PROCEDURE PERFORMED: Lumbar epidural steroid injection   NOTE: The patient is a 81 y.o. female who returns to [place] for further evaluation and treatment of pain involving the lumbar and lower extremity region. MRI revealed the patient to be with degenerative changes lumbar spine anterolisthesis L4-L5, facet arthropathy, left-sided neural impingement L2-3 and L3-4 with advanced lumbar spondylosis since previous study. L2-3 bilateral facet arthropathy. The risks, benefits, and expectations of the procedure have been discussed and explained to the patient who was understanding and in agreement with suggested treatment plan. We will proceed with lumbar epidural steroid injection as discussed and as explained to the patient who is willing to proceed with procedure as planned.   DESCRIPTION OF PROCEDURE: Lumbar epidural steroid injection with IV Versed, IV fentanyl conscious sedation, EKG, blood pressure, pulse, and pulse oximetry monitoring. The procedure was performed with the patient in the prone position under fluoroscopic guidance. A local anesthetic skin wheal of 1.5% plain lidocaine was accomplished at proposed entry site. An 18-gauge Tuohy epidural needle was inserted at the L 5 vertebral body level right of the midline via loss-of-resistance technique with negative heme and negative CSF return. A total of 4 mL of Preservative-Free normal saline with 40 mg of Kenalog injected incrementally via epidurally placed needle. Needle was removed.    A total of 40 mg of Kenalog was utilized for the procedure.   The patient tolerated the injection well.    PLAN:   1. Medications: We will continue presently prescribed medications. 2. Will consider modification of treatment regimen pending response to treatment rendered on today's visit and follow-up evaluation. 3. The patient is to  follow-up with primary care physician Dr. Iona Beard regarding blood pressure and general medical condition status post lumbar epidural steroid injection performed on today's visit. 4. Surgical evaluation. 5. Neurological evaluation. 6. The patient may be a candidate for radiofrequency procedures, implantation device, and other treatment pending response to treatment and follow-up evaluation. 7. The patient has been advised to adhere to proper body mechanics and avoid activities which appear to aggravate condition. 8. The patient has been advised to call the [facility] prior to scheduled return appointment should there be significant change in condition or should there be sign  The patient is understanding and agrees with the suggested  treatment plan     Review of Systems     Objective:   Physical Exam        Assessment & Plan:

## 2014-11-10 ENCOUNTER — Telehealth: Payer: Self-pay | Admitting: *Deleted

## 2014-11-10 NOTE — Telephone Encounter (Signed)
Denies complications post procedure. 

## 2014-12-08 ENCOUNTER — Ambulatory Visit: Payer: Medicare Other | Attending: Pain Medicine | Admitting: Pain Medicine

## 2014-12-08 ENCOUNTER — Encounter: Payer: Self-pay | Admitting: Pain Medicine

## 2014-12-08 VITALS — BP 127/59 | HR 64 | Temp 98.2°F | Resp 18 | Ht 60.0 in | Wt 169.0 lb

## 2014-12-08 DIAGNOSIS — M47816 Spondylosis without myelopathy or radiculopathy, lumbar region: Secondary | ICD-10-CM | POA: Insufficient documentation

## 2014-12-08 DIAGNOSIS — M4316 Spondylolisthesis, lumbar region: Secondary | ICD-10-CM | POA: Diagnosis not present

## 2014-12-08 DIAGNOSIS — M79604 Pain in right leg: Secondary | ICD-10-CM | POA: Diagnosis present

## 2014-12-08 DIAGNOSIS — M48061 Spinal stenosis, lumbar region without neurogenic claudication: Secondary | ICD-10-CM

## 2014-12-08 DIAGNOSIS — M5136 Other intervertebral disc degeneration, lumbar region: Secondary | ICD-10-CM | POA: Diagnosis not present

## 2014-12-08 DIAGNOSIS — M533 Sacrococcygeal disorders, not elsewhere classified: Secondary | ICD-10-CM | POA: Diagnosis not present

## 2014-12-08 DIAGNOSIS — M5137 Other intervertebral disc degeneration, lumbosacral region: Secondary | ICD-10-CM

## 2014-12-08 DIAGNOSIS — M5416 Radiculopathy, lumbar region: Secondary | ICD-10-CM

## 2014-12-08 DIAGNOSIS — M1288 Other specific arthropathies, not elsewhere classified, other specified site: Secondary | ICD-10-CM | POA: Insufficient documentation

## 2014-12-08 DIAGNOSIS — M48062 Spinal stenosis, lumbar region with neurogenic claudication: Secondary | ICD-10-CM

## 2014-12-08 DIAGNOSIS — M545 Low back pain: Secondary | ICD-10-CM | POA: Diagnosis present

## 2014-12-08 DIAGNOSIS — M79605 Pain in left leg: Secondary | ICD-10-CM | POA: Diagnosis present

## 2014-12-08 NOTE — Progress Notes (Signed)
Safety precautions to be maintained throughout the outpatient stay will include: orient to surroundings, keep bed in low position, maintain call bell within reach at all times, provide assistance with transfer out of bed and ambulation.  

## 2014-12-08 NOTE — Progress Notes (Signed)
   Subjective:    Patient ID: Isabella Cochran, female    DOB: [birth year], 81 y.o.   MRN: [id number]  HPI  Patient is a 81-year-old female [facility] evaluation and treatment of pain involving the lower back and lower extremity regions. Patient is with pain well-controlled present time. Patient states that she is without significant pain and is able to perform most activities of daily living without experiencing any saving pain. Region and also is able to obtain restful sleep without significant pain interfering with ability to obtain restful sleep. At the present time we will avoid interventional treatment and will remain available to consider modifications of treatment should there be change in patient's condition. The patient was with understanding and in agreement status treatment plan    Review of Systems     Objective:   Physical Exam  There was minimal tenderness of the splenius capitis and occipitalis musculature region. There was minimal tennis of the cervical facet cervical paraspinal musculature region. Tinel and Phalen's maneuver were without increase of pain of any significant degree. There was unremarkable Spurling's maneuver. Palpation over the thoracic facet thoracic paraspinal musculature region was without increased pain of any significant degree. Palpation over the lumbar paraspinal musculature region lumbar facet region was without significant increase of pain. Lateral bending rotation and extension and palpation of the lumbar facets reproduce mild discomfort. There was mild tennis of the PSIS and PII S region. There was mild tenderness of the gluteal and piriformis musculature regions. Minimal tenderness of the greater trochanteric region and iliotibial band region. Straight leg raising was tolerates approximately 30 without increased pain with dorsiflexion noted. No definite sensory deficit of dermatomal distribution was detected. There was  negative clonus negative Homans. Abdomen was nontender and no costovertebral angle tenderness was noted.      Assessment & Plan:    Degenerative disc disease lumbar spine Multilevel degenerative changes with anterior listhesis L4-L5 level with facet arthropathy with L2-L3, L4-L5 advanced lumbar spondylosis, change worse at L2-3, with bilateral facet arthropathy is concern regarding lumbar degenerative changes with neurogenic claudication contributing to patient's symptomatology of significant degree   Lumbar facet syndrome  Sacroiliac joint dysfunction   Plan  Continue present medications  F/U PCP  Dr. Iona Beard for evaliation of  BP and general medical  condition  F/U surgical evaluationAs needed. Patient doing well without desire to consider further surgical intervention  F/U neurological evaluation  May consider radiofrequency rhizolysis or intraspinal procedures pending response to present treatment and F/U evaluation   Patient to call [facility] should patient have concerns prior to scheduled return appointmen.

## 2014-12-08 NOTE — Patient Instructions (Signed)
Continue present medication  F/U PCP Dr. Iona Beard for evaliation of  BP and general medical  condition  F/U surgical evaluation  F/U neurological evaluation  May consider radiofrequency rhizolysis or intraspinal procedures pending response to present treatment and F/U evaluation   Patient to call [facility] should patient have concerns prior to scheduled return appointmen.

## 2014-12-16 ENCOUNTER — Other Ambulatory Visit: Payer: Self-pay | Admitting: Surgery

## 2014-12-16 DIAGNOSIS — Z853 Personal history of malignant neoplasm of breast: Secondary | ICD-10-CM

## 2014-12-25 ENCOUNTER — Ambulatory Visit: Payer: Medicare Other

## 2014-12-25 ENCOUNTER — Other Ambulatory Visit: Payer: Medicare Other

## 2015-01-05 ENCOUNTER — Telehealth: Payer: Self-pay | Admitting: Pain Medicine

## 2015-01-05 NOTE — Telephone Encounter (Signed)
Thank you :)

## 2015-01-05 NOTE — Telephone Encounter (Signed)
Feeling better and cancel appt for 01-07-15

## 2015-01-07 ENCOUNTER — Encounter: Payer: Medicare Other | Admitting: Pain Medicine

## 2015-01-08 ENCOUNTER — Other Ambulatory Visit: Payer: Medicare Other

## 2015-01-08 ENCOUNTER — Ambulatory Visit: Payer: Medicare Other

## 2015-01-13 ENCOUNTER — Ambulatory Visit
Admission: RE | Admit: 2015-01-13 | Discharge: 2015-01-13 | Disposition: A | Discharge status: Home / Self Care | Payer: Medicare Other | Source: Ambulatory Visit | Attending: Surgery | Admitting: Surgery

## 2015-01-13 ENCOUNTER — Other Ambulatory Visit: Payer: Self-pay | Admitting: Surgery

## 2015-01-13 DIAGNOSIS — Z853 Personal history of malignant neoplasm of breast: Secondary | ICD-10-CM

## 2015-03-10 ENCOUNTER — Inpatient Hospital Stay: Payer: Medicare Other

## 2015-03-10 ENCOUNTER — Inpatient Hospital Stay: Payer: Medicare Other | Admitting: Family Medicine

## 2015-03-22 ENCOUNTER — Other Ambulatory Visit: Payer: Self-pay | Admitting: *Deleted

## 2015-03-22 ENCOUNTER — Inpatient Hospital Stay: Payer: Medicare Other | Admitting: Internal Medicine

## 2015-03-22 ENCOUNTER — Encounter: Payer: Self-pay | Admitting: *Deleted

## 2015-03-22 ENCOUNTER — Inpatient Hospital Stay: Payer: Medicare Other

## 2015-03-22 DIAGNOSIS — D0511 Intraductal carcinoma in situ of right breast: Secondary | ICD-10-CM | POA: Insufficient documentation

## 2015-03-29 ENCOUNTER — Telehealth: Payer: Self-pay | Admitting: *Deleted

## 2015-03-29 MED ORDER — ANASTROZOLE 1 MG PO TABS: 1.0000 mg | ORAL_TABLET | Freq: Every day | ORAL | Status: DC

## 2015-03-29 NOTE — Telephone Encounter (Signed)
Patient has no showed for last 2 appts, I called her and she states she dod not know about those appts and has an appt for 12/9. Refill for #14 tabs sent to pharmacy

## 2015-04-09 ENCOUNTER — Inpatient Hospital Stay (HOSPITAL_BASED_OUTPATIENT_CLINIC_OR_DEPARTMENT_OTHER): Payer: Medicare Other | Admitting: Internal Medicine

## 2015-04-09 ENCOUNTER — Inpatient Hospital Stay: Payer: Medicare Other | Attending: Internal Medicine

## 2015-04-09 VITALS — BP 148/83 | HR 62 | Temp 97.1°F | Wt 180.6 lb

## 2015-04-09 DIAGNOSIS — Z79811 Long term (current) use of aromatase inhibitors: Secondary | ICD-10-CM | POA: Diagnosis not present

## 2015-04-09 DIAGNOSIS — Z7984 Long term (current) use of oral hypoglycemic drugs: Secondary | ICD-10-CM

## 2015-04-09 DIAGNOSIS — M8580 Other specified disorders of bone density and structure, unspecified site: Secondary | ICD-10-CM

## 2015-04-09 DIAGNOSIS — C50911 Malignant neoplasm of unspecified site of right female breast: Secondary | ICD-10-CM | POA: Diagnosis not present

## 2015-04-09 DIAGNOSIS — M1990 Unspecified osteoarthritis, unspecified site: Secondary | ICD-10-CM

## 2015-04-09 DIAGNOSIS — Z170 Estrogen receptor positive status [ER+]: Secondary | ICD-10-CM

## 2015-04-09 DIAGNOSIS — M549 Dorsalgia, unspecified: Secondary | ICD-10-CM | POA: Insufficient documentation

## 2015-04-09 DIAGNOSIS — D0511 Intraductal carcinoma in situ of right breast: Secondary | ICD-10-CM

## 2015-04-09 DIAGNOSIS — I10 Essential (primary) hypertension: Secondary | ICD-10-CM

## 2015-04-09 DIAGNOSIS — M109 Gout, unspecified: Secondary | ICD-10-CM | POA: Diagnosis not present

## 2015-04-09 DIAGNOSIS — E119 Type 2 diabetes mellitus without complications: Secondary | ICD-10-CM

## 2015-04-09 DIAGNOSIS — M129 Arthropathy, unspecified: Secondary | ICD-10-CM

## 2015-04-09 DIAGNOSIS — G8929 Other chronic pain: Secondary | ICD-10-CM | POA: Insufficient documentation

## 2015-04-09 DIAGNOSIS — Z800 Family history of malignant neoplasm of digestive organs: Secondary | ICD-10-CM

## 2015-04-09 DIAGNOSIS — Z803 Family history of malignant neoplasm of breast: Secondary | ICD-10-CM | POA: Insufficient documentation

## 2015-04-09 DIAGNOSIS — Z79899 Other long term (current) drug therapy: Secondary | ICD-10-CM | POA: Insufficient documentation

## 2015-04-09 LAB — HEPATIC FUNCTION PANEL
ALT: 14 U/L (ref 14–54)
AST: 23 U/L (ref 15–41)
Albumin: 3.9 g/dL (ref 3.5–5.0)
Alkaline Phosphatase: 109 U/L (ref 38–126)
BILIRUBIN DIRECT: 0.2 mg/dL (ref 0.1–0.5)
BILIRUBIN INDIRECT: 0.4 mg/dL (ref 0.3–0.9)
BILIRUBIN TOTAL: 0.6 mg/dL (ref 0.3–1.2)
Total Protein: 7 g/dL (ref 6.5–8.1)

## 2015-04-09 LAB — CBC WITH DIFFERENTIAL/PLATELET
BASOS ABS: 0.1 10*3/uL (ref 0–0.1)
BASOS PCT: 1 %
EOS ABS: 0.2 10*3/uL (ref 0–0.7)
EOS PCT: 3 %
HCT: 42.2 % (ref 35.0–47.0)
Hemoglobin: 14.2 g/dL (ref 12.0–16.0)
Lymphocytes Relative: 33 %
Lymphs Abs: 2.8 10*3/uL (ref 1.0–3.6)
MCH: 26.9 pg (ref 26.0–34.0)
MCHC: 33.6 g/dL (ref 32.0–36.0)
MCV: 80.2 fL (ref 80.0–100.0)
MONO ABS: 0.7 10*3/uL (ref 0.2–0.9)
MONOS PCT: 9 %
NEUTROS ABS: 4.8 10*3/uL (ref 1.4–6.5)
Neutrophils Relative %: 54 %
PLATELETS: 234 10*3/uL (ref 150–440)
RBC: 5.26 MIL/uL — ABNORMAL HIGH (ref 3.80–5.20)
RDW: 15 % — AB (ref 11.5–14.5)
WBC: 8.6 10*3/uL (ref 3.6–11.0)

## 2015-04-09 LAB — CREATININE, SERUM
CREATININE: 1.02 mg/dL — AB (ref 0.44–1.00)
GFR, EST AFRICAN AMERICAN: 58 mL/min — AB (ref >60)
GFR, EST NON AFRICAN AMERICAN: 50 mL/min — AB (ref >60)

## 2015-04-09 NOTE — Progress Notes (Signed)
[place] OFFICE PROGRESS NOTE  Patient Care Team: Sharyne Peach, MD as PCP - General (Family Medicine)   SUMMARY OF ONCOLOGIC HISTORY:  # AUG 2015-RIGHT BREAST MAMMARY CA- MICROINVASIVE [2m]; STAGE I [pT171m; pN0 s/p Lumpec & SLNBx; Dr.Smith] ER/PR > 90%; Her 2 NEG s/p RT; Arimidex    INTERVAL HISTORY:  This is my first interaction with the patient since I joined the practice September 2016. I reviewed the patient's prior charts/pertinent labs/imaging in detail; findings are summarized above.   [age]ear old female patient with above history of stage I ER/PR positive HER-2/neu negative breast cancer on adjuvant Arimidex is here for follow-up. Patient complains of chronic back pain for which she needed injections the past. This is not any significant worse. She denies any new lumps or bumps.  Patient denies any significant hot flashes.    REVIEW OF SYSTEMS:  A complete 10 point review of system is done which is negative except mentioned above/history of present illness.   PAST MEDICAL HISTORY :  Past Medical History  Diagnosis Date  . Arthritis   . Diabetes mellitus without complication ([facility]  . Hypertension   . Chronic back pain   . Breast cancer ([facility] [place]- right    invasive mammary carcinoma--ER/PR+, Her2neu- radiation  . Gout   . Osteoarthritis   . Chronic back pain     PAST SURGICAL HISTORY :   Past Surgical History  Procedure Laterality Date  . Appendectomy    . Cholecystectomy    . Abdominal hysterectomy    . Breast lumpectomy    . Knee arthroscopy Left   . Breast biopsy Right 2015    +  . Breast biopsy Bilateral     neg    FAMILY HISTORY :   Family History  Problem Relation Age of Onset  . Liver cancer Mother     liver  . Heart disease Mother   . Liver cancer Sister   . Breast cancer Paternal Aunt   . Breast cancer Paternal Aunt   . Stroke Father     SOCIAL HISTORY:   Social History  Substance Use Topics  . Smoking status:  Never Smoker   . Smokeless tobacco: Never Used  . Alcohol Use: No    ALLERGIES:  is allergic to gabapentin.  MEDICATIONS:  Current Outpatient Prescriptions  Medication Sig Dispense Refill  . allopurinol (ZYLOPRIM) 100 MG tablet Take 100 mg by mouth at bedtime.    . Marland Kitchennastrozole (ARIMIDEX) 1 MG tablet Take 1 tablet (1 mg total) by mouth daily. 14 tablet 0  . atenolol (TENORMIN) 100 MG tablet Take 100 mg by mouth daily.    . Calcium Carb-Cholecalciferol (CALCIUM 600 + D PO) Take 2 tablets by mouth daily.     . cefUROXime (CEFTIN) 250 MG tablet Take 1 tablet (250 mg total) by mouth 2 (two) times daily with a meal. 14 tablet 0  . celecoxib (CELEBREX) 200 MG capsule Take 200 mg by mouth at bedtime.    . clobetasol cream (TEMOVATE) 0.2.42 Apply 1 application topically 2 (two) times daily.    . colchicine 0.6 MG tablet Take 0.6 mg by mouth as needed.     . Marland KitchenLUoxetine (PROZAC) 20 MG capsule Take 20 mg by mouth at bedtime.    . Marland Kitchenbuprofen (ADVIL,MOTRIN) 200 MG tablet Take 200 mg by mouth every 6 (six) hours as needed.    . Marland Kitchenisinopril-hydrochlorothiazide (PRINZIDE,ZESTORETIC) 20-25 MG per tablet Take 1 tablet by mouth at bedtime.    .Marland Kitchen  metFORMIN (GLUCOPHAGE) 500 MG tablet Take 500 mg by mouth at bedtime.    Marland Kitchen NIFEdipine (PROCARDIA-XL/ADALAT-CC/NIFEDICAL-XL) 30 MG 24 hr tablet Take 30 mg by mouth at bedtime.    Marland Kitchen oxybutynin (DITROPAN) 5 MG tablet Take 5 mg by mouth 2 (two) times daily.     Current Facility-Administered Medications  Medication Dose Route Frequency Provider Last Rate Last Dose  . ceFAZolin (ANCEF) IVPB 1 g/50 mL premix  1 g Intravenous Once Mohammed Kindle, MD        PHYSICAL EXAMINATION: ECOG PERFORMANCE STATUS: 0 - Asymptomatic  BP 148/83 mmHg  Pulse 62  Temp(Src) 97.1 F (36.2 C) (Tympanic)  Wt 180 lb 8.9 oz (81.9 kg)  Filed Weights   04/09/15 1508  Weight: 180 lb 8.9 oz (81.9 kg)    GENERAL: Well-nourished well-developed; Alert, no distress and comfortable.   Alone.   EYES: no pallor or icterus OROPHARYNX: no thrush or ulceration; good dentition  NECK: supple, no masses felt LYMPH:  no palpable lymphadenopathy in the cervical, axillary or inguinal regions LUNGS: clear to auscultation and  No wheeze or crackles HEART/CVS: regular rate & rhythm and no murmurs; No lower extremity edema ABDOMEN:abdomen soft, non-tender and normal bowel sounds Musculoskeletal:no cyanosis of digits and no clubbing  PSYCH: alert & oriented x 3 with fluent speech NEURO: no focal motor/sensory deficits SKIN:  no rashes or significant lesions  LABORATORY DATA:  I have reviewed the data as listed    Component Value Date/Time   NA 139 12/22/2013 1457   K 4.3 12/22/2013 1457   CL 104 12/22/2013 1457   CO2 29 12/22/2013 1457   GLUCOSE 83 12/22/2013 1457   BUN 37* 12/22/2013 1457   CREATININE 1.02* 04/09/2015 1454   CREATININE 1.10 12/22/2013 1457   CALCIUM 9.9 12/22/2013 1457   PROT 7.0 04/09/2015 1454   ALBUMIN 3.9 04/09/2015 1454   AST 23 04/09/2015 1454   ALT 14 04/09/2015 1454   ALKPHOS 109 04/09/2015 1454   BILITOT 0.6 04/09/2015 1454   GFRNONAA 50* 04/09/2015 1454   GFRNONAA 47* 12/22/2013 1457   GFRAA 58* 04/09/2015 1454   GFRAA 55* 12/22/2013 1457    No results found for: SPEP, UPEP  Lab Results  Component Value Date   WBC 8.6 04/09/2015   NEUTROABS 4.8 04/09/2015   HGB 14.2 04/09/2015   HCT 42.2 04/09/2015   MCV 80.2 04/09/2015   PLT 234 04/09/2015      Chemistry      Component Value Date/Time   NA 139 12/22/2013 1457   K 4.3 12/22/2013 1457   CL 104 12/22/2013 1457   CO2 29 12/22/2013 1457   BUN 37* 12/22/2013 1457   CREATININE 1.02* 04/09/2015 1454   CREATININE 1.10 12/22/2013 1457      Component Value Date/Time   CALCIUM 9.9 12/22/2013 1457   ALKPHOS 109 04/09/2015 1454   AST 23 04/09/2015 1454   ALT 14 04/09/2015 1454   BILITOT 0.6 04/09/2015 1454       RADIOGRAPHIC STUDIES: I have personally reviewed the radiological images  as listed and agreed with the findings in the report. No results found.   ASSESSMENT & PLAN:    # RIGHT BREAST CA stage I ER/PR positive HER-2/neu negative on adjuvant Arimidex. Clinically no evidence of recurrence. Patient recent mammogram September 2016 within normal limits.  # use of aromatase inhibitor- patient tolerating it well without any major side effects. Patient has chronic back pain which I do not think is related to  her AI use.  # OSTEOPENIA [BMD- 1515]- Reviewed the bone density test discussed with the patient. For now recommend continued calcium and vitamin D. Recommend repeat bone density test in July 2017.  # 25 minutes face-to-face with the patient discussing the above plan of care; more than 50% of time spent on prognosis/ natural history; counseling and coordination.      Cammie Sickle, MD 04/09/2015 3:31 PM

## 2015-04-09 NOTE — Progress Notes (Signed)
Patient offers no complaints today. 

## 2015-04-27 ENCOUNTER — Telehealth: Payer: Self-pay | Admitting: *Deleted

## 2015-04-27 MED ORDER — ANASTROZOLE 1 MG PO TABS: 1.0000 mg | ORAL_TABLET | Freq: Every day | ORAL | Status: DC

## 2015-04-27 NOTE — Telephone Encounter (Signed)
Escribed

## 2015-05-13 ENCOUNTER — Encounter: Payer: Self-pay | Admitting: Radiation Oncology

## 2015-05-13 ENCOUNTER — Ambulatory Visit
Admission: RE | Admit: 2015-05-13 | Discharge: 2015-05-13 | Disposition: A | Discharge status: Home / Self Care | Payer: Medicare Other | Source: Ambulatory Visit | Attending: Radiation Oncology | Admitting: Radiation Oncology

## 2015-05-13 VITALS — BP 129/79 | HR 59 | Temp 98.4°F | Resp 20 | Wt 179.6 lb

## 2015-05-13 DIAGNOSIS — Z853 Personal history of malignant neoplasm of breast: Secondary | ICD-10-CM

## 2015-05-13 NOTE — Progress Notes (Signed)
Radiation Oncology Follow up Note  Name: Isabella Cochran   Date:   05/13/2015 MRN:  [id number] DOB: Dec 08, 1930    This 82 y.o. female presents to the clinic today for follow-up for stage I invasive mammary carcinoma the right breast status post wide local excision and whole breast radiation.  REFERRING PROVIDER: Sharyne Peach, MD  HPI: Patient is an 82 year old female now 1 year out having completed radiation therapy to her right breast for a T1 1 invasive mammary carcinoma ER/PR positive. Seen today in routine follow-up she is doing well. She specifically denies breast tenderness cough or bone pain.. She had mammograms back in September which were fine showing no evidence of disease. She continues on aromatase tolerate that well without side effect.  COMPLICATIONS OF TREATMENT: none  FOLLOW UP COMPLIANCE: keeps appointments   PHYSICAL EXAM:  BP 129/79 mmHg  Pulse 59  Temp(Src) 98.4 F (36.9 C)  Resp 20  Wt 179 lb 9 oz (81.45 kg) Lungs are clear to A&P cardiac examination essentially unremarkable with regular rate and rhythm. No dominant mass or nodularity is noted in either breast in 2 positions examined. Incision is well-healed. No axillary or supraclavicular adenopathy is appreciated. Cosmetic result is excellent. Well-developed well-nourished patient in NAD. HEENT reveals PERLA, EOMI, discs not visualized.  Oral cavity is clear. No oral mucosal lesions are identified. Neck is clear without evidence of cervical or supraclavicular adenopathy. Lungs are clear to A&P. Cardiac examination is essentially unremarkable with regular rate and rhythm without murmur rub or thrill. Abdomen is benign with no organomegaly or masses noted. Motor sensory and DTR levels are equal and symmetric in the upper and lower extremities. Cranial nerves II through XII are grossly intact. Proprioception is intact. No peripheral adenopathy or edema is identified. No motor or sensory levels are noted. Crude visual  fields are within normal range.  RADIOLOGY RESULTS: Mammograms are reviewed  PLAN: At this time patient continues to do well 1 year out with no evidence of disease. I've asked to see her back in 1 year for follow-up. She continues on aromatase inhibitor therapy without side effect. Patient knows to call with any concerns.  I would like to take this opportunity for allowing me to participate in the care of your patient.Armstead Peaks., MD

## 2015-07-13 IMAGING — MG MM BREAST NEEDLE LOCALIZATION*R*
4 series · 4 of 4 positions shown · non-contrast
Comparison: Previous exams.

CLINICAL DATA: Needle localization requested prior to excisional
biopsy of papilloma in the right breast.

EXAM:
NEEDLE LOCALIZATION OF THE RIGHT BREAST WITH MAMMO GUIDANCE

[R CC (1 of 2)]
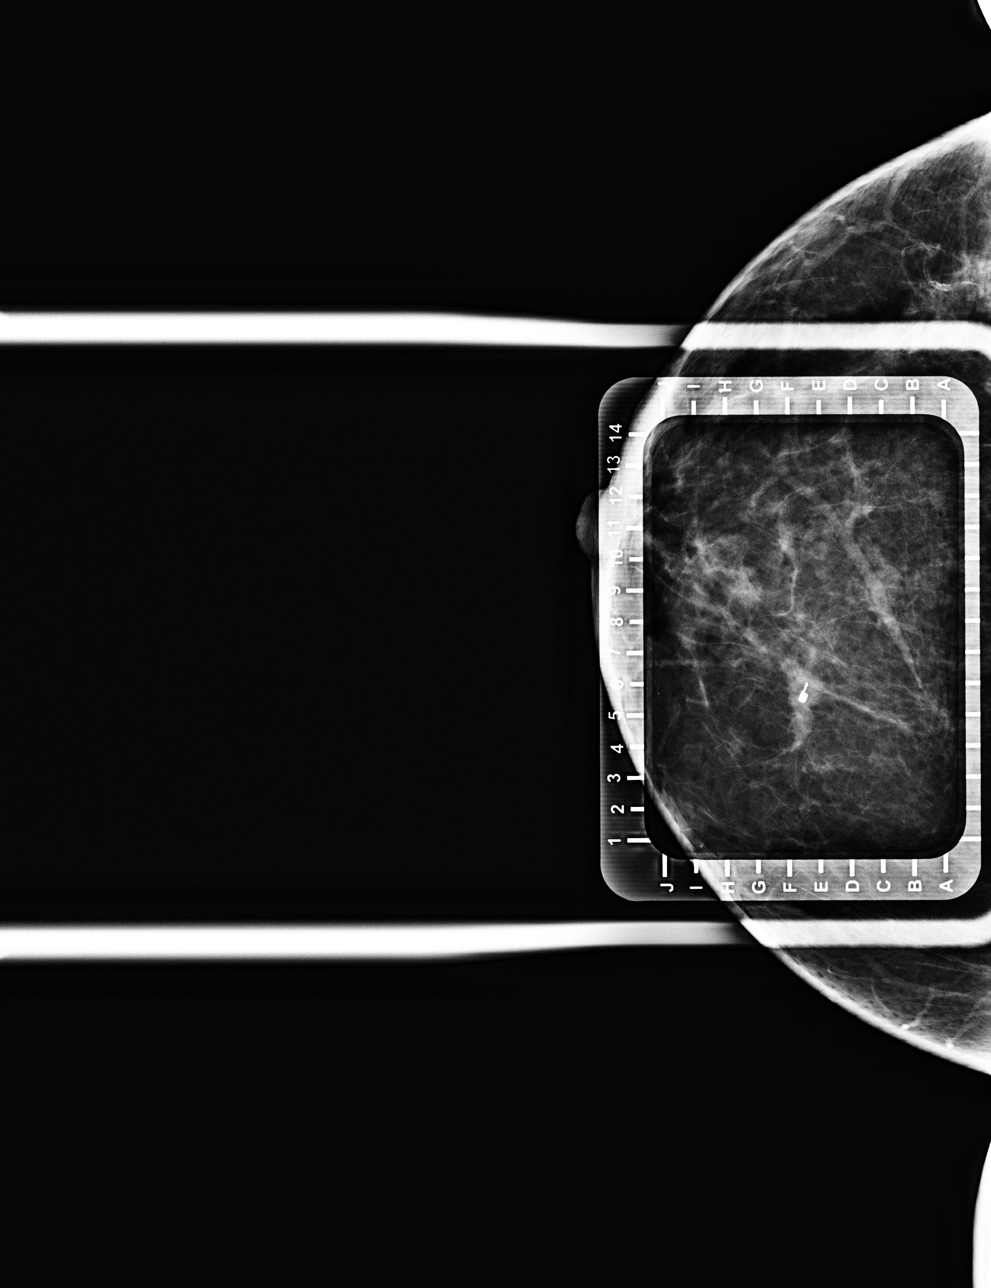

[R CC (2 of 2)]
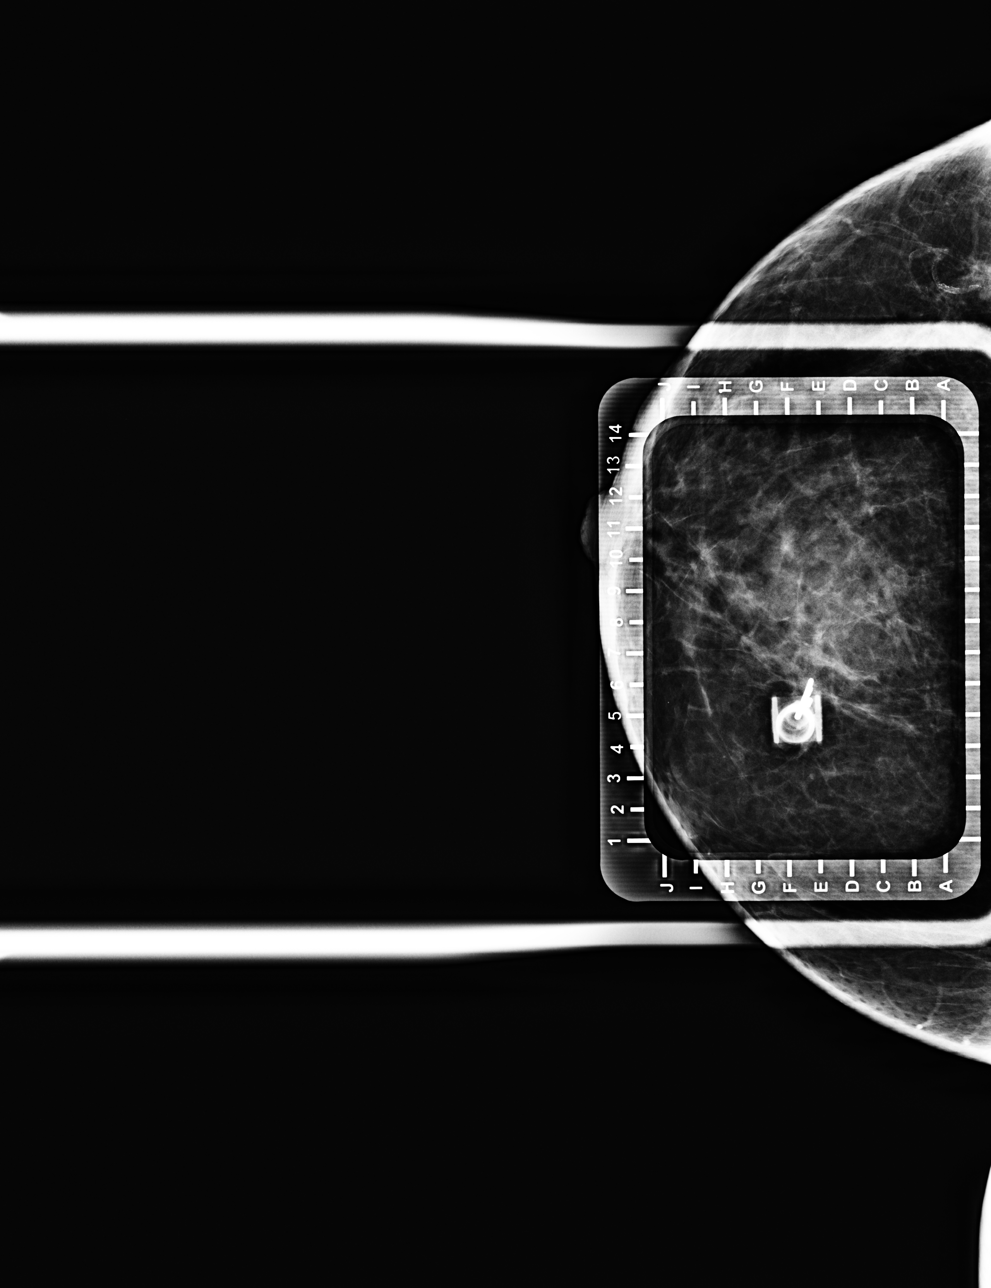

[R ML (1 of 2)]
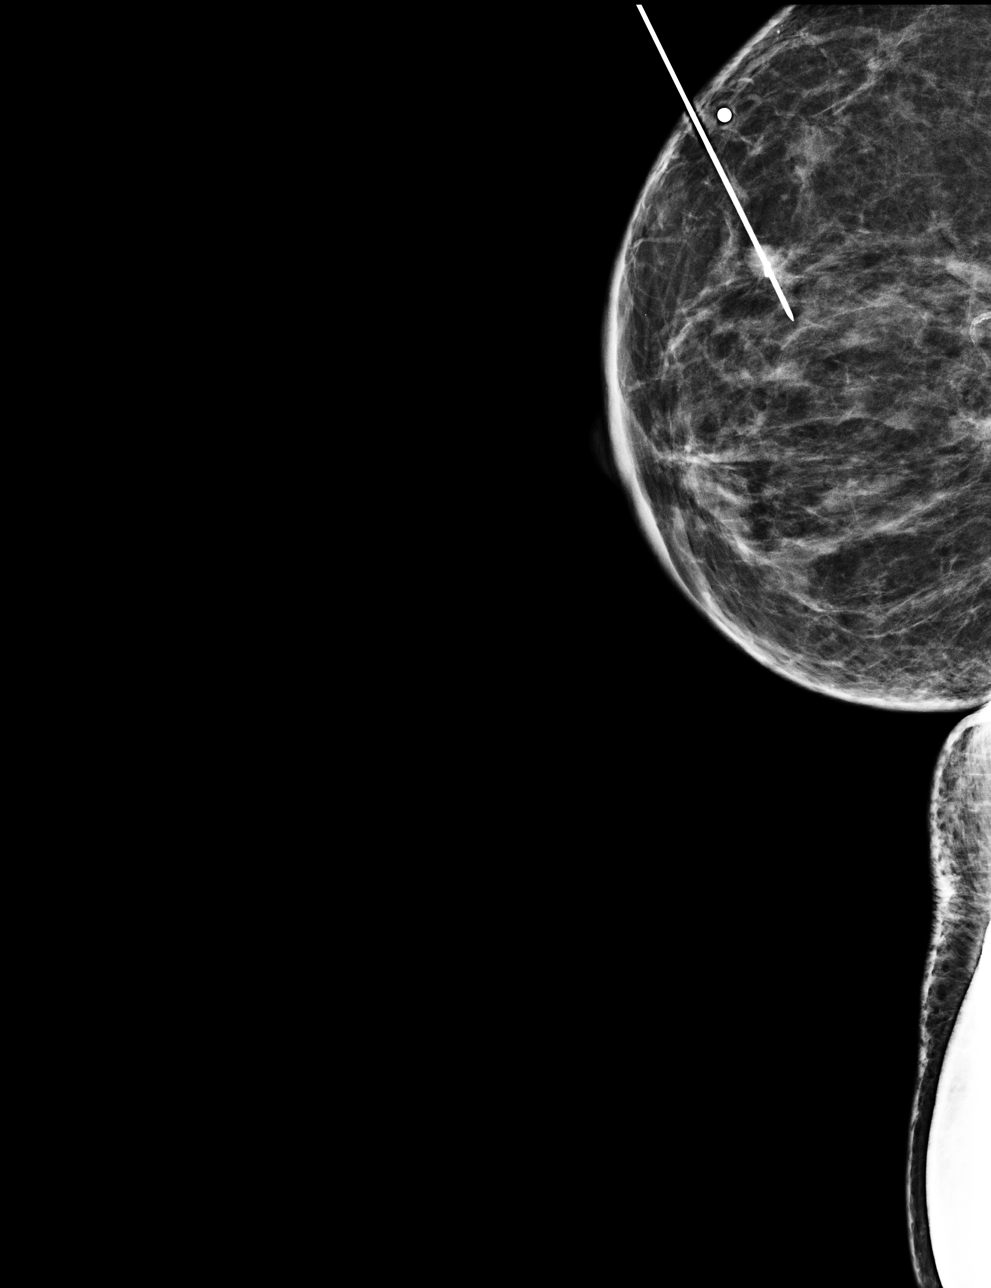

[R ML (2 of 2)]
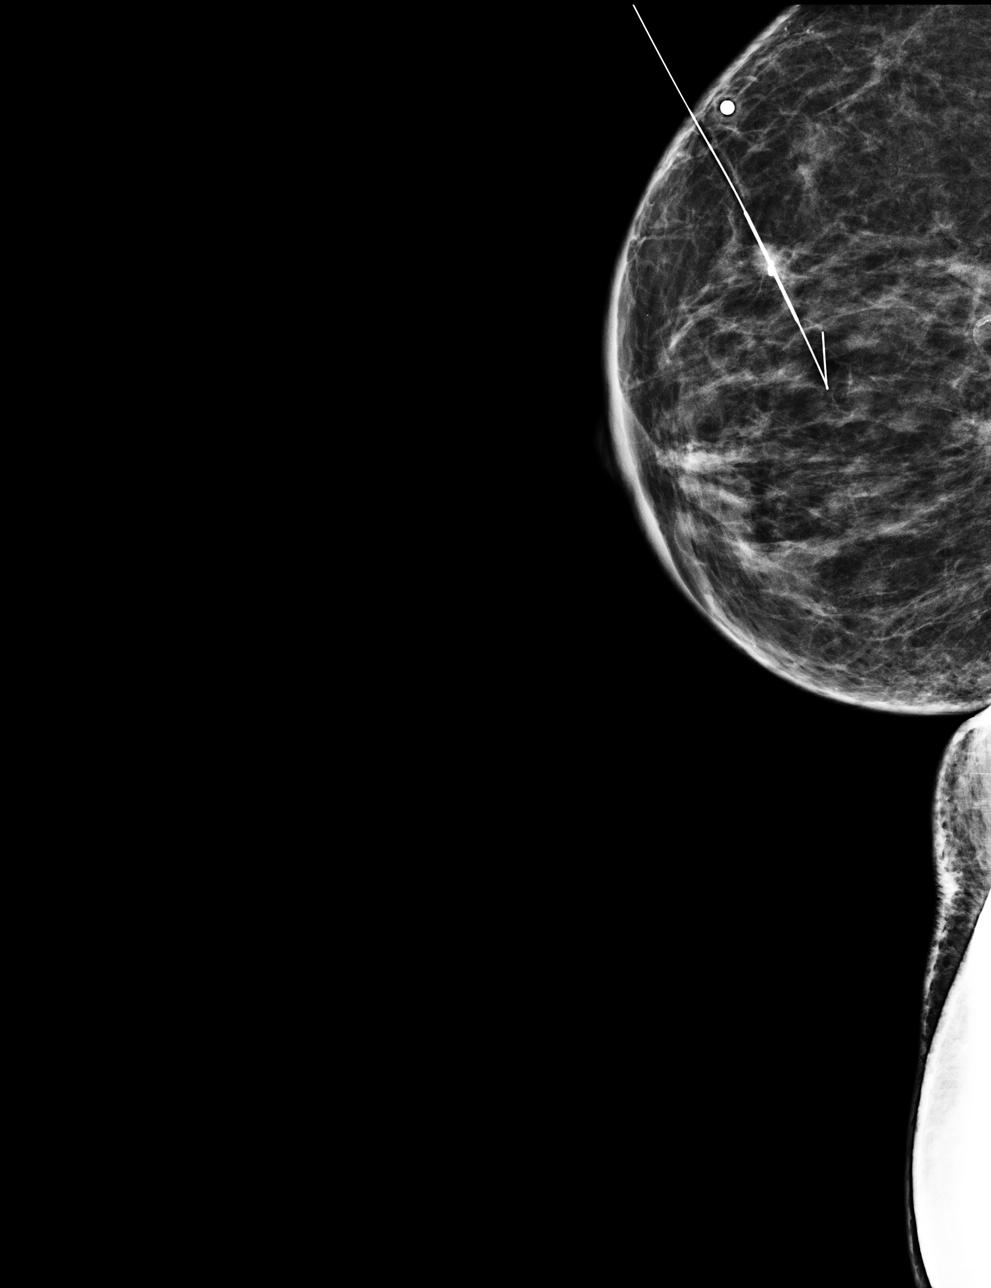

[4 of 4 positions shown; findings below may reference images not displayed]

FINDINGS: Patient presents for needle localization prior to excisional biopsy.
I met with the patient and we discussed the procedure of needle
localization including benefits and alternatives. We discussed the
high likelihood of a successful procedure. We discussed the risks of
the procedure, including infection, bleeding, tissue injury, and
further surgery. Informed, written consent was given. The usual
time-out protocol was performed immediately prior to the procedure.

Using mammographic guidance, sterile technique, 2% lidocaine and a 7
cm modified Kopans needle, a coil shaped biopsy clip was localized
using a superior to inferior approach. The films were marked for Dr.
Randhall.
IMPRESSION: Needle localization right breast. No apparent complications.

## 2015-10-19 ENCOUNTER — Ambulatory Visit: Payer: Medicare Other | Attending: Internal Medicine

## 2015-10-21 ENCOUNTER — Inpatient Hospital Stay: Payer: Medicare Other | Admitting: Internal Medicine

## 2015-11-04 ENCOUNTER — Other Ambulatory Visit: Payer: Self-pay | Admitting: Family Medicine

## 2015-11-04 ENCOUNTER — Inpatient Hospital Stay: Payer: Medicare Other | Attending: Internal Medicine | Admitting: Family Medicine

## 2015-11-04 VITALS — BP 118/66 | HR 58 | Temp 97.5°F | Resp 18 | Wt 178.2 lb

## 2015-11-04 DIAGNOSIS — G8929 Other chronic pain: Secondary | ICD-10-CM | POA: Diagnosis not present

## 2015-11-04 DIAGNOSIS — C50911 Malignant neoplasm of unspecified site of right female breast: Secondary | ICD-10-CM

## 2015-11-04 DIAGNOSIS — Z170 Estrogen receptor positive status [ER+]: Secondary | ICD-10-CM | POA: Insufficient documentation

## 2015-11-04 DIAGNOSIS — G4700 Insomnia, unspecified: Secondary | ICD-10-CM | POA: Diagnosis not present

## 2015-11-04 DIAGNOSIS — M109 Gout, unspecified: Secondary | ICD-10-CM | POA: Diagnosis not present

## 2015-11-04 DIAGNOSIS — M129 Arthropathy, unspecified: Secondary | ICD-10-CM | POA: Insufficient documentation

## 2015-11-04 DIAGNOSIS — Z7984 Long term (current) use of oral hypoglycemic drugs: Secondary | ICD-10-CM | POA: Diagnosis not present

## 2015-11-04 DIAGNOSIS — Z800 Family history of malignant neoplasm of digestive organs: Secondary | ICD-10-CM | POA: Diagnosis not present

## 2015-11-04 DIAGNOSIS — M1990 Unspecified osteoarthritis, unspecified site: Secondary | ICD-10-CM

## 2015-11-04 DIAGNOSIS — D0511 Intraductal carcinoma in situ of right breast: Secondary | ICD-10-CM

## 2015-11-04 DIAGNOSIS — Z803 Family history of malignant neoplasm of breast: Secondary | ICD-10-CM | POA: Diagnosis not present

## 2015-11-04 DIAGNOSIS — Z79811 Long term (current) use of aromatase inhibitors: Secondary | ICD-10-CM

## 2015-11-04 DIAGNOSIS — Z79899 Other long term (current) drug therapy: Secondary | ICD-10-CM | POA: Insufficient documentation

## 2015-11-04 DIAGNOSIS — E119 Type 2 diabetes mellitus without complications: Secondary | ICD-10-CM

## 2015-11-04 DIAGNOSIS — M549 Dorsalgia, unspecified: Secondary | ICD-10-CM | POA: Diagnosis not present

## 2015-11-04 DIAGNOSIS — M8580 Other specified disorders of bone density and structure, unspecified site: Secondary | ICD-10-CM | POA: Diagnosis not present

## 2015-11-04 DIAGNOSIS — I10 Essential (primary) hypertension: Secondary | ICD-10-CM | POA: Diagnosis not present

## 2015-11-04 NOTE — Progress Notes (Signed)
[place] OFFICE PROGRESS NOTE  Patient Care Team: Sharyne Peach, MD as PCP - General (Family Medicine)   SUMMARY OF ONCOLOGIC HISTORY:  # AUG 2015-RIGHT BREAST MAMMARY CA- MICROINVASIVE [59m]; STAGE I [pT161m; pN0 s/p Lumpec & SLNBx; Dr.Smith] ER/PR > 90%; Her 2 NEG s/p RT; Arimidex    INTERVAL HISTORY: [age]ear old female patient with above history of stage I ER/PR positive HER-2/neu negative breast cancer on adjuvant Arimidex  Patient is here for further follow-up regarding carcinoma of the right breast. Patient reports overall feeling very well. She does state that she has chronic back pain but even this has been insignificant over the last several months. She states the only complaint that she has at this time is inability to follow sleep at night. She has discussed this with her primary care provider and stare currently working on solutions. She does report missing her appointment for DEXA scan as well as mammogram. She is tolerating her Arimidex, calcium, and vitamin D very well. Patient denies any significant hot flashes.    REVIEW OF SYSTEMS:  A complete 10 point review of system is done which is negative except mentioned above/history of present illness.   PAST MEDICAL HISTORY :  Past Medical History  Diagnosis Date  . Arthritis   . Diabetes mellitus without complication ([facility]  . Hypertension   . Chronic back pain   . Breast cancer ([facility]- right    invasive mammary carcinoma--ER/PR+, Her2neu- radiation  . Gout   . Osteoarthritis   . Chronic back pain     PAST SURGICAL HISTORY :   Past Surgical History  Procedure Laterality Date  . Appendectomy    . Cholecystectomy    . Abdominal hysterectomy    . Breast lumpectomy    . Knee arthroscopy Left   . Breast biopsy Right 2015    +  . Breast biopsy Bilateral     neg    FAMILY HISTORY :   Family History  Problem Relation Age of Onset  . Liver cancer Mother     liver  . Heart disease Mother    . Liver cancer Sister   . Breast cancer Paternal Aunt   . Breast cancer Paternal Aunt   . Stroke Father     SOCIAL HISTORY:   Social History  Substance Use Topics  . Smoking status: Never Smoker   . Smokeless tobacco: Never Used  . Alcohol Use: No    ALLERGIES:  is allergic to aspirin and gabapentin.  MEDICATIONS:  Current Outpatient Prescriptions  Medication Sig Dispense Refill  . allopurinol (ZYLOPRIM) 100 MG tablet Take 100 mg by mouth at bedtime.    . Marland Kitchennastrozole (ARIMIDEX) 1 MG tablet Take 1 tablet (1 mg total) by mouth daily. 90 tablet 1  . atenolol (TENORMIN) 100 MG tablet Take 100 mg by mouth daily.    . Calcium Carb-Cholecalciferol (CALCIUM 600 + D PO) Take 2 tablets by mouth daily.     . celecoxib (CELEBREX) 200 MG capsule Take 200 mg by mouth at bedtime.    . clobetasol cream (TEMOVATE) 0.6.26 Apply 1 application topically 2 (two) times daily.    . colchicine 0.6 MG tablet Take 0.6 mg by mouth as needed.     . Marland KitchenLUoxetine (PROZAC) 20 MG capsule Take 20 mg by mouth at bedtime.    . Marland Kitchenisinopril-hydrochlorothiazide (PRINZIDE,ZESTORETIC) 20-25 MG per tablet Take 1 tablet by mouth at bedtime.    . metFORMIN (GLUCOPHAGE) 500 MG tablet  Take 500 mg by mouth at bedtime.    Marland Kitchen NIFEdipine (PROCARDIA-XL/ADALAT-CC/NIFEDICAL-XL) 30 MG 24 hr tablet Take 30 mg by mouth at bedtime.    Marland Kitchen oxybutynin (DITROPAN) 5 MG tablet Take 5 mg by mouth 2 (two) times daily.     Current Facility-Administered Medications  Medication Dose Route Frequency Provider Last Rate Last Dose  . ceFAZolin (ANCEF) IVPB 1 g/50 mL premix  1 g Intravenous Once Mohammed Kindle, MD        PHYSICAL EXAMINATION: ECOG PERFORMANCE STATUS: 0 - Asymptomatic  BP 118/66 mmHg  Pulse 58  Temp(Src) 97.5 F (36.4 C) (Tympanic)  Resp 18  Wt 178 lb 4 oz (80.854 kg)  Filed Weights   11/04/15 1021  Weight: 178 lb 4 oz (80.854 kg)    GENERAL: Well-nourished well-developed; Alert, no distress and comfortable.   Alone.   EYES: no pallor or icterus OROPHARYNX: no thrush or ulceration; good dentition  NECK: supple, no masses felt LYMPH:  no palpable lymphadenopathy in the cervical, axillary or inguinal regions LUNGS: clear to auscultation and  No wheeze or crackles HEART/CVS: regular rate & rhythm and no murmurs; No lower extremity edema BREAST: Breasts were examined in 2 positions, no masses or nodularity present in either breast. Axilla also examined with no abnormal findings. ABDOMEN:abdomen soft, non-tender and normal bowel sounds Musculoskeletal:no cyanosis of digits and no clubbing  PSYCH: alert & oriented x 3 with fluent speech NEURO: no focal motor/sensory deficits SKIN:  no rashes or significant lesions  LABORATORY DATA:  I have reviewed the data as listed    Component Value Date/Time   NA 139 12/22/2013 1457   K 4.3 12/22/2013 1457   CL 104 12/22/2013 1457   CO2 29 12/22/2013 1457   GLUCOSE 83 12/22/2013 1457   BUN 37* 12/22/2013 1457   CREATININE 1.02* 04/09/2015 1454   CREATININE 1.10 12/22/2013 1457   CALCIUM 9.9 12/22/2013 1457   PROT 7.0 04/09/2015 1454   ALBUMIN 3.9 04/09/2015 1454   AST 23 04/09/2015 1454   ALT 14 04/09/2015 1454   ALKPHOS 109 04/09/2015 1454   BILITOT 0.6 04/09/2015 1454   GFRNONAA 50* 04/09/2015 1454   GFRNONAA 47* 12/22/2013 1457   GFRAA 58* 04/09/2015 1454   GFRAA 55* 12/22/2013 1457    No results found for: SPEP, UPEP  Lab Results  Component Value Date   WBC 8.6 04/09/2015   NEUTROABS 4.8 04/09/2015   HGB 14.2 04/09/2015   HCT 42.2 04/09/2015   MCV 80.2 04/09/2015   PLT 234 04/09/2015      Chemistry      Component Value Date/Time   NA 139 12/22/2013 1457   K 4.3 12/22/2013 1457   CL 104 12/22/2013 1457   CO2 29 12/22/2013 1457   BUN 37* 12/22/2013 1457   CREATININE 1.02* 04/09/2015 1454   CREATININE 1.10 12/22/2013 1457      Component Value Date/Time   CALCIUM 9.9 12/22/2013 1457   ALKPHOS 109 04/09/2015 1454   AST 23 04/09/2015  1454   ALT 14 04/09/2015 1454   BILITOT 0.6 04/09/2015 1454       RADIOGRAPHIC STUDIES: I have personally reviewed the radiological images as listed and agreed with the findings in the report. No results found.   ASSESSMENT & PLAN:    # RIGHT BREAST CA stage I ER/PR positive HER-2/neu negative on adjuvant Arimidex. Clinically no evidence of recurrence. Patient recent mammogram September 2016 within normal limits. Mammogram is due to be repeated in September  2017.  # use of aromatase inhibitor- patient tolerating it well without any major side effects. Patient has chronic back pain which I do not think is related to her AI use.  # OSTEOPENIA [BMD- 6728]- Reviewed the bone density test discussed with the patient. For now recommend continued calcium and vitamin D. We will schedule this today for July or August 2017.  #Insomnia. Patient was tried several over-the-counter remedies including melatonin, a generic sleep aid, etc. Patient is not able to take Benadryl as it makes her more hyper. Recommended that she discuss with her PCP regarding a prescription strength mild sleep aid.  Patient will return to our clinic in approximately 6 months. Dr. Rogue Bussing was available for consultation and review of plan of care for this patient.     Evlyn Kanner, NP 11/04/2015 10:42 AM

## 2015-11-04 NOTE — Progress Notes (Signed)
Patient states she does not sleep well.  Otherwise, no complaints.

## 2015-12-28 ENCOUNTER — Other Ambulatory Visit: Payer: Self-pay | Admitting: Internal Medicine

## 2016-01-10 ENCOUNTER — Other Ambulatory Visit: Payer: Self-pay | Admitting: Family Medicine

## 2016-01-10 ENCOUNTER — Ambulatory Visit (HOSPITAL_COMMUNITY)
Admission: RE | Admit: 2016-01-10 | Discharge: 2016-01-10 | Disposition: A | Discharge status: Home / Self Care | Payer: Medicare Other | Source: Ambulatory Visit | Attending: Family Medicine | Admitting: Family Medicine

## 2016-01-10 ENCOUNTER — Ambulatory Visit
Admission: RE | Admit: 2016-01-10 | Discharge: 2016-01-10 | Disposition: A | Discharge status: Home / Self Care | Payer: Medicare Other | Source: Ambulatory Visit | Attending: Family Medicine | Admitting: Family Medicine

## 2016-01-10 DIAGNOSIS — W19XXXA Unspecified fall, initial encounter: Secondary | ICD-10-CM | POA: Insufficient documentation

## 2016-01-10 DIAGNOSIS — R0781 Pleurodynia: Secondary | ICD-10-CM

## 2016-01-17 ENCOUNTER — Other Ambulatory Visit: Payer: Medicare Other

## 2016-01-17 ENCOUNTER — Ambulatory Visit: Payer: Medicare Other

## 2016-02-15 ENCOUNTER — Ambulatory Visit
Admission: RE | Admit: 2016-02-15 | Discharge: 2016-02-15 | Disposition: A | Discharge status: Home / Self Care | Payer: Medicare Other | Source: Ambulatory Visit | Attending: Family Medicine | Admitting: Family Medicine

## 2016-02-15 ENCOUNTER — Other Ambulatory Visit: Payer: Self-pay | Admitting: Surgery

## 2016-02-15 ENCOUNTER — Other Ambulatory Visit: Payer: Self-pay | Admitting: Family Medicine

## 2016-02-15 DIAGNOSIS — M85852 Other specified disorders of bone density and structure, left thigh: Secondary | ICD-10-CM | POA: Insufficient documentation

## 2016-02-15 DIAGNOSIS — D0511 Intraductal carcinoma in situ of right breast: Secondary | ICD-10-CM

## 2016-02-15 DIAGNOSIS — Z780 Asymptomatic menopausal state: Secondary | ICD-10-CM | POA: Insufficient documentation

## 2016-05-04 ENCOUNTER — Ambulatory Visit: Payer: Medicare Other | Admitting: Internal Medicine

## 2016-05-04 ENCOUNTER — Other Ambulatory Visit: Payer: Medicare Other

## 2016-05-11 ENCOUNTER — Other Ambulatory Visit: Payer: Medicare Other

## 2016-05-11 ENCOUNTER — Ambulatory Visit: Payer: Medicare Other | Admitting: Internal Medicine

## 2016-05-18 ENCOUNTER — Ambulatory Visit: Payer: Medicare Other | Admitting: Radiation Oncology

## 2016-05-26 ENCOUNTER — Inpatient Hospital Stay: Payer: Medicare Other | Attending: Internal Medicine | Admitting: Internal Medicine

## 2016-05-26 ENCOUNTER — Inpatient Hospital Stay (HOSPITAL_BASED_OUTPATIENT_CLINIC_OR_DEPARTMENT_OTHER): Payer: Medicare Other

## 2016-05-26 DIAGNOSIS — Z90710 Acquired absence of both cervix and uterus: Secondary | ICD-10-CM | POA: Insufficient documentation

## 2016-05-26 DIAGNOSIS — E119 Type 2 diabetes mellitus without complications: Secondary | ICD-10-CM | POA: Diagnosis not present

## 2016-05-26 DIAGNOSIS — Z79811 Long term (current) use of aromatase inhibitors: Secondary | ICD-10-CM | POA: Insufficient documentation

## 2016-05-26 DIAGNOSIS — Z79899 Other long term (current) drug therapy: Secondary | ICD-10-CM | POA: Diagnosis not present

## 2016-05-26 DIAGNOSIS — D0511 Intraductal carcinoma in situ of right breast: Secondary | ICD-10-CM

## 2016-05-26 DIAGNOSIS — M8580 Other specified disorders of bone density and structure, unspecified site: Secondary | ICD-10-CM | POA: Diagnosis not present

## 2016-05-26 DIAGNOSIS — Z803 Family history of malignant neoplasm of breast: Secondary | ICD-10-CM | POA: Insufficient documentation

## 2016-05-26 DIAGNOSIS — G4700 Insomnia, unspecified: Secondary | ICD-10-CM | POA: Diagnosis not present

## 2016-05-26 DIAGNOSIS — Z9049 Acquired absence of other specified parts of digestive tract: Secondary | ICD-10-CM | POA: Diagnosis not present

## 2016-05-26 DIAGNOSIS — M549 Dorsalgia, unspecified: Secondary | ICD-10-CM | POA: Insufficient documentation

## 2016-05-26 DIAGNOSIS — I10 Essential (primary) hypertension: Secondary | ICD-10-CM | POA: Insufficient documentation

## 2016-05-26 DIAGNOSIS — Z170 Estrogen receptor positive status [ER+]: Secondary | ICD-10-CM | POA: Diagnosis not present

## 2016-05-26 DIAGNOSIS — M109 Gout, unspecified: Secondary | ICD-10-CM | POA: Insufficient documentation

## 2016-05-26 DIAGNOSIS — Z800 Family history of malignant neoplasm of digestive organs: Secondary | ICD-10-CM | POA: Diagnosis not present

## 2016-05-26 DIAGNOSIS — Z794 Long term (current) use of insulin: Secondary | ICD-10-CM | POA: Diagnosis not present

## 2016-05-26 DIAGNOSIS — G8929 Other chronic pain: Secondary | ICD-10-CM | POA: Diagnosis not present

## 2016-05-26 DIAGNOSIS — M1990 Unspecified osteoarthritis, unspecified site: Secondary | ICD-10-CM | POA: Diagnosis not present

## 2016-05-26 DIAGNOSIS — M129 Arthropathy, unspecified: Secondary | ICD-10-CM | POA: Insufficient documentation

## 2016-05-26 LAB — CBC WITH DIFFERENTIAL/PLATELET
Basophils Absolute: 0.1 10*3/uL (ref 0–0.1)
Basophils Relative: 1 %
EOS PCT: 4 %
Eosinophils Absolute: 0.4 10*3/uL (ref 0–0.7)
HCT: 41.6 % (ref 35.0–47.0)
HEMOGLOBIN: 14.2 g/dL (ref 12.0–16.0)
LYMPHS ABS: 3.2 10*3/uL (ref 1.0–3.6)
LYMPHS PCT: 33 %
MCH: 27.7 pg (ref 26.0–34.0)
MCHC: 34.1 g/dL (ref 32.0–36.0)
MCV: 81.3 fL (ref 80.0–100.0)
MONOS PCT: 10 %
Monocytes Absolute: 1 10*3/uL — ABNORMAL HIGH (ref 0.2–0.9)
Neutro Abs: 5.2 10*3/uL (ref 1.4–6.5)
Neutrophils Relative %: 52 %
Platelets: 239 10*3/uL (ref 150–440)
RBC: 5.12 MIL/uL (ref 3.80–5.20)
RDW: 15.2 % — ABNORMAL HIGH (ref 11.5–14.5)
WBC: 9.8 10*3/uL (ref 3.6–11.0)

## 2016-05-26 LAB — COMPREHENSIVE METABOLIC PANEL
ALK PHOS: 105 U/L (ref 38–126)
ALT: 13 U/L — ABNORMAL LOW (ref 14–54)
AST: 24 U/L (ref 15–41)
Albumin: 4 g/dL (ref 3.5–5.0)
Anion gap: 4 — ABNORMAL LOW (ref 5–15)
BILIRUBIN TOTAL: 0.8 mg/dL (ref 0.3–1.2)
BUN: 28 mg/dL — ABNORMAL HIGH (ref 6–20)
CALCIUM: 9.5 mg/dL (ref 8.9–10.3)
CO2: 29 mmol/L (ref 22–32)
CREATININE: 0.89 mg/dL (ref 0.44–1.00)
Chloride: 104 mmol/L (ref 101–111)
GFR, EST NON AFRICAN AMERICAN: 58 mL/min — AB (ref >60)
Glucose, Bld: 108 mg/dL — ABNORMAL HIGH (ref 65–99)
Potassium: 3.8 mmol/L (ref 3.5–5.1)
Sodium: 137 mmol/L (ref 135–145)
TOTAL PROTEIN: 7.1 g/dL (ref 6.5–8.1)

## 2016-05-26 NOTE — Progress Notes (Signed)
[facility] OFFICE PROGRESS NOTE  Patient Care Team: Sharyne Peach, MD as PCP - General (Family Medicine)   SUMMARY OF ONCOLOGIC HISTORY:  # AUG 2015-RIGHT BREAST MAMMARY CA- MICROINVASIVE [53m]; STAGE I [pT152m; pN0 s/p Lumpec & SLNBx; Dr.Smith] ER/PR > 90%; Her 2 NEG s/p RT; Arimidex    INTERVAL HISTORY: [age]ear old female patient with above history of stage I ER/PR positive HER-2/neu negative breast cancer on adjuvant Arimidex  Patient is here for further follow-up regarding carcinoma of the right breast. Patient reports overall feeling very well. She does state that she has chronic back pain but even this has been insignificant over the last several months. She states the only complaint that she has at this time is inability to fall and stay asleep at night. She has discussed this with her primary care provider and they are working on solutions. In October she had a mammogram (BIRADS 2) and Dexa (T score -1.7).  She is tolerating her Arimidex, calcium, and vitamin D very well. Patient denies any significant hot flashes.    REVIEW OF SYSTEMS:  A complete 10 point review of system is done which is negative except mentioned above/history of present illness.   PAST MEDICAL HISTORY :  Past Medical History:  Diagnosis Date  . Arthritis   . Breast cancer ([facility]- right   invasive mammary carcinoma--ER/PR+, Her2neu- radiation  . Chronic back pain   . Chronic back pain   . Diabetes mellitus without complication ([facility]  . Gout   . Hypertension   . Osteoarthritis     PAST SURGICAL HISTORY :   Past Surgical History:  Procedure Laterality Date  . ABDOMINAL HYSTERECTOMY    . APPENDECTOMY    . BREAST BIOPSY Right 2015   +  . BREAST BIOPSY Bilateral    neg  . BREAST LUMPECTOMY    . CHOLECYSTECTOMY    . KNEE ARTHROSCOPY Left     FAMILY HISTORY :   Family History  Problem Relation Age of Onset  . Liver cancer Mother     liver  . Heart disease Mother   . Stroke  Father   . Liver cancer Sister   . Breast cancer Paternal Aunt   . Breast cancer Paternal Aunt     SOCIAL HISTORY:   Social History  Substance Use Topics  . Smoking status: Never Smoker  . Smokeless tobacco: Never Used  . Alcohol use No    ALLERGIES:  is allergic to aspirin and gabapentin.  MEDICATIONS:  Current Outpatient Prescriptions  Medication Sig Dispense Refill  . allopurinol (ZYLOPRIM) 100 MG tablet Take 100 mg by mouth at bedtime.    . Marland Kitchennastrozole (ARIMIDEX) 1 MG tablet TAKE 1 TABLET (1 MG TOTAL) BY MOUTH DAILY. 90 tablet 1  . atenolol (TENORMIN) 100 MG tablet Take 100 mg by mouth daily.    . Calcium Carb-Cholecalciferol (CALCIUM 600 + D PO) Take 2 tablets by mouth daily.     . celecoxib (CELEBREX) 200 MG capsule Take 200 mg by mouth at bedtime.    . clobetasol cream (TEMOVATE) 0.5.10 Apply 1 application topically 2 (two) times daily.    . colchicine 0.6 MG tablet Take 0.6 mg by mouth as needed.     . Marland KitchenLUoxetine (PROZAC) 20 MG capsule Take 20 mg by mouth at bedtime.    . Marland Kitchenisinopril-hydrochlorothiazide (PRINZIDE,ZESTORETIC) 20-25 MG per tablet Take 1 tablet by mouth at bedtime.    . metFORMIN (GLUCOPHAGE) 500 MG tablet Take  500 mg by mouth at bedtime.    Marland Kitchen NIFEdipine (PROCARDIA-XL/ADALAT-CC/NIFEDICAL-XL) 30 MG 24 hr tablet Take 30 mg by mouth at bedtime.    Marland Kitchen oxybutynin (DITROPAN) 5 MG tablet Take 5 mg by mouth 2 (two) times daily.     Current Facility-Administered Medications  Medication Dose Route Frequency Provider Last Rate Last Dose  . ceFAZolin (ANCEF) IVPB 1 g/50 mL premix  1 g Intravenous Once Mohammed Kindle, MD        PHYSICAL EXAMINATION: ECOG PERFORMANCE STATUS: 0 - Asymptomatic  BP (!) 130/56 (BP Location: Left Arm, Patient Position: Sitting)   Pulse (!) 57   Temp (!) 96.6 F (35.9 C) (Tympanic)   Resp 18   Wt 180 lb (81.6 kg)   BMI 35.15 kg/m   Filed Weights   05/26/16 1056  Weight: 180 lb (81.6 kg)    GENERAL: Well-nourished well-developed;  Alert, no distress and comfortable.   Alone.  EYES: no pallor or icterus OROPHARYNX: no thrush or ulceration; good dentition  NECK: supple, no masses felt LYMPH:  no palpable lymphadenopathy in the cervical, axillary or inguinal regions LUNGS: clear to auscultation and  No wheeze or crackles HEART/CVS: regular rate & rhythm and no murmurs; No lower extremity edema BREAST: Breasts were examined in 2 positions, no masses or nodularity present in either breast. Axilla also examined with no abnormal findings. ABDOMEN:abdomen soft, non-tender and normal bowel sounds Musculoskeletal:no cyanosis of digits and no clubbing  PSYCH: alert & oriented x 3 with fluent speech NEURO: no focal motor/sensory deficits SKIN:  no rashes or significant lesions  LABORATORY DATA:  I have reviewed the data as listed    Component Value Date/Time   NA 137 05/26/2016 0920   NA 139 12/22/2013 1457   K 3.8 05/26/2016 0920   K 4.3 12/22/2013 1457   CL 104 05/26/2016 0920   CL 104 12/22/2013 1457   CO2 29 05/26/2016 0920   CO2 29 12/22/2013 1457   GLUCOSE 108 (H) 05/26/2016 0920   GLUCOSE 83 12/22/2013 1457   BUN 28 (H) 05/26/2016 0920   BUN 37 (H) 12/22/2013 1457   CREATININE 0.89 05/26/2016 0920   CREATININE 1.10 12/22/2013 1457   CALCIUM 9.5 05/26/2016 0920   CALCIUM 9.9 12/22/2013 1457   PROT 7.1 05/26/2016 0920   ALBUMIN 4.0 05/26/2016 0920   AST 24 05/26/2016 0920   ALT 13 (L) 05/26/2016 0920   ALKPHOS 105 05/26/2016 0920   BILITOT 0.8 05/26/2016 0920   GFRNONAA 58 (L) 05/26/2016 0920   GFRNONAA 47 (L) 12/22/2013 1457   GFRAA >60 05/26/2016 0920   GFRAA 55 (L) 12/22/2013 1457    No results found for: SPEP, UPEP  Lab Results  Component Value Date   WBC 9.8 05/26/2016   NEUTROABS 5.2 05/26/2016   HGB 14.2 05/26/2016   HCT 41.6 05/26/2016   MCV 81.3 05/26/2016   PLT 239 05/26/2016      Chemistry      Component Value Date/Time   NA 137 05/26/2016 0920   NA 139 12/22/2013 1457   K 3.8  05/26/2016 0920   K 4.3 12/22/2013 1457   CL 104 05/26/2016 0920   CL 104 12/22/2013 1457   CO2 29 05/26/2016 0920   CO2 29 12/22/2013 1457   BUN 28 (H) 05/26/2016 0920   BUN 37 (H) 12/22/2013 1457   CREATININE 0.89 05/26/2016 0920   CREATININE 1.10 12/22/2013 1457      Component Value Date/Time   CALCIUM 9.5 05/26/2016  0920   CALCIUM 9.9 12/22/2013 1457   ALKPHOS 105 05/26/2016 0920   AST 24 05/26/2016 0920   ALT 13 (L) 05/26/2016 0920   BILITOT 0.8 05/26/2016 0920       RADIOGRAPHIC STUDIES: I have personally reviewed the radiological images as listed and agreed with the findings in the report. No results found.   ASSESSMENT & PLAN:    Ductal carcinoma in situ (DCIS) of right breast  # RIGHT BREAST CA stage I ER/PR positive HER-2/neu negative on adjuvant Arimidex. Clinically no evidence of recurrence. Patient recent mammogram October 2017 within normal limits. Mammogram is due to be repeated in October 2018. Follows up with surgeon yearly and states had he performs yearly breast exams. Deferred for now.   # use of aromatase inhibitor- patient tolerating it well without any major side effects.   # OSTEOPENIA [BMD- 2017]- Reviewed the bone density test discussed with the patient. Continue calcium and vitamin D. Dexa scan October 2017- T score -1.7.  #Insomnia. Patient was tried several over-the-counter remedies including melatonin, a generic sleep aid, etc. Recommended that she discuss with her PCP regarding a prescription strength mild sleep aid.  # Follow-up with [place] 6 months with MD/labs.     Faythe Casa, NP    Jacquelin Hawking, NP 05/26/2016 1:46 PM

## 2016-05-26 NOTE — Assessment & Plan Note (Addendum)
#  RIGHT BREAST CA stage I ER/PR positive HER-2/neu negative on adjuvant Arimidex. Clinically no evidence of recurrence. Patient recent mammogram October 2017 within normal limits. Mammogram is due to be repeated in October 2018. Follows up with surgeon yearly and states had he performs yearly breast exams. Deferred for now.   # use of aromatase inhibitor- patient tolerating it well without any major side effects.   # OSTEOPENIA [BMD- 2017]- Reviewed the bone density test discussed with the patient. Continue calcium and vitamin D. Dexa scan October 2017- T score -1.7.  #Insomnia. Patient was tried several over-the-counter remedies including melatonin, a generic sleep aid, etc. Recommended that she discuss with her PCP regarding a prescription strength mild sleep aid.  # Follow-up with us 6 months with MD/labs.     

## 2016-05-26 NOTE — Progress Notes (Signed)
Patient offers no concerns today. 

## 2016-05-29 ENCOUNTER — Ambulatory Visit
Admission: RE | Admit: 2016-05-29 | Discharge: 2016-05-29 | Disposition: A | Discharge status: Home / Self Care | Payer: Medicare Other | Source: Ambulatory Visit | Attending: Radiation Oncology | Admitting: Radiation Oncology

## 2016-05-29 ENCOUNTER — Encounter: Payer: Self-pay | Admitting: Radiation Oncology

## 2016-05-29 VITALS — BP 139/68 | HR 62 | Temp 97.5°F | Resp 18 | Wt 181.9 lb

## 2016-05-29 DIAGNOSIS — D0511 Intraductal carcinoma in situ of right breast: Secondary | ICD-10-CM | POA: Insufficient documentation

## 2016-05-29 DIAGNOSIS — Z923 Personal history of irradiation: Secondary | ICD-10-CM | POA: Diagnosis not present

## 2016-05-29 DIAGNOSIS — Z79811 Long term (current) use of aromatase inhibitors: Secondary | ICD-10-CM | POA: Insufficient documentation

## 2016-05-29 DIAGNOSIS — Z170 Estrogen receptor positive status [ER+]: Secondary | ICD-10-CM | POA: Insufficient documentation

## 2016-05-29 NOTE — Progress Notes (Signed)
Radiation Oncology Follow up Note  Name: Isabella Cochran   Date:   05/29/2016 MRN:  [id number] DOB: 23-Aug-1930    This 83 y.o. female presents to the clinic today for 2 year follow-up for stage I invasive mammary carcinoma the right breast.  REFERRING PROVIDER: Sharyne Peach, MD  HPI: Patient is a 83 year old female now out 2 years having completed radiation therapy to her right breast for a T1 invasive mammary carcinoma ER/PR positive status post wide local excision. She is seen today in routine follow-up and is doing well. She specifically denies breast tenderness cough or bone pain.Marland Kitchen Her last mammograms were back in October 2017 and BI-RADS 2 benign suggested 1 year follow-up. She is currently on aromatase tolerate that well without side effect.  COMPLICATIONS OF TREATMENT: none  FOLLOW UP COMPLIANCE: keeps appointments   PHYSICAL EXAM:  BP 139/68   Pulse 62   Temp 97.5 F (36.4 C)   Resp 18   Wt 181 lb 14.1 oz (82.5 kg)   BMI 35.52 kg/m  Lungs are clear to A&P cardiac examination essentially unremarkable with regular rate and rhythm. No dominant mass or nodularity is noted in either breast in 2 positions examined. Incision is well-healed. No axillary or supraclavicular adenopathy is appreciated. Cosmetic result is excellent. Well-developed well-nourished patient in NAD. HEENT reveals PERLA, EOMI, discs not visualized.  Oral cavity is clear. No oral mucosal lesions are identified. Neck is clear without evidence of cervical or supraclavicular adenopathy. Lungs are clear to A&P. Cardiac examination is essentially unremarkable with regular rate and rhythm without murmur rub or thrill. Abdomen is benign with no organomegaly or masses noted. Motor sensory and DTR levels are equal and symmetric in the upper and lower extremities. Cranial nerves II through XII are grossly intact. Proprioception is intact. No peripheral adenopathy or edema is identified. No motor or sensory levels are noted.  Crude visual fields are within normal range.  RADIOLOGY RESULTS: Most recent mammograms are reviewed  PLAN: Present time she continues to do well with no evidence of disease. I'm please were overall progress. She continues on Ranexa without side effect. Or rescheduled for follow-up mammograms. I will see her back in 1 year for follow-up. Patient knows to call sooner with any concerns.  I would like to take this opportunity to thank you for allowing me to participate in the care of your patient.Armstead Peaks., MD

## 2016-08-14 ENCOUNTER — Other Ambulatory Visit: Payer: Self-pay | Admitting: Internal Medicine

## 2016-11-24 ENCOUNTER — Inpatient Hospital Stay: Payer: Medicare Other | Attending: Internal Medicine

## 2016-11-24 ENCOUNTER — Inpatient Hospital Stay (HOSPITAL_BASED_OUTPATIENT_CLINIC_OR_DEPARTMENT_OTHER): Payer: Medicare Other | Admitting: Internal Medicine

## 2016-11-24 VITALS — BP 128/60 | HR 80 | Temp 97.6°F | Resp 20 | Ht 60.0 in | Wt 180.0 lb

## 2016-11-24 DIAGNOSIS — Z79811 Long term (current) use of aromatase inhibitors: Secondary | ICD-10-CM

## 2016-11-24 DIAGNOSIS — M8580 Other specified disorders of bone density and structure, unspecified site: Secondary | ICD-10-CM

## 2016-11-24 DIAGNOSIS — M109 Gout, unspecified: Secondary | ICD-10-CM | POA: Diagnosis not present

## 2016-11-24 DIAGNOSIS — I10 Essential (primary) hypertension: Secondary | ICD-10-CM | POA: Diagnosis not present

## 2016-11-24 DIAGNOSIS — C50811 Malignant neoplasm of overlapping sites of right female breast: Secondary | ICD-10-CM | POA: Diagnosis not present

## 2016-11-24 DIAGNOSIS — M1990 Unspecified osteoarthritis, unspecified site: Secondary | ICD-10-CM

## 2016-11-24 DIAGNOSIS — D0511 Intraductal carcinoma in situ of right breast: Secondary | ICD-10-CM

## 2016-11-24 DIAGNOSIS — Z7984 Long term (current) use of oral hypoglycemic drugs: Secondary | ICD-10-CM | POA: Diagnosis not present

## 2016-11-24 DIAGNOSIS — E119 Type 2 diabetes mellitus without complications: Secondary | ICD-10-CM

## 2016-11-24 DIAGNOSIS — M129 Arthropathy, unspecified: Secondary | ICD-10-CM | POA: Insufficient documentation

## 2016-11-24 DIAGNOSIS — M542 Cervicalgia: Secondary | ICD-10-CM | POA: Insufficient documentation

## 2016-11-24 DIAGNOSIS — Z170 Estrogen receptor positive status [ER+]: Secondary | ICD-10-CM

## 2016-11-24 DIAGNOSIS — G8929 Other chronic pain: Secondary | ICD-10-CM

## 2016-11-24 DIAGNOSIS — Z803 Family history of malignant neoplasm of breast: Secondary | ICD-10-CM | POA: Insufficient documentation

## 2016-11-24 LAB — CBC WITH DIFFERENTIAL/PLATELET
BASOS ABS: 0.1 10*3/uL (ref 0–0.1)
BASOS PCT: 1 %
EOS ABS: 0.2 10*3/uL (ref 0–0.7)
Eosinophils Relative: 2 %
HEMATOCRIT: 42.7 % (ref 35.0–47.0)
Hemoglobin: 14.6 g/dL (ref 12.0–16.0)
Lymphocytes Relative: 31 %
Lymphs Abs: 2.8 10*3/uL (ref 1.0–3.6)
MCH: 27.9 pg (ref 26.0–34.0)
MCHC: 34.1 g/dL (ref 32.0–36.0)
MCV: 81.8 fL (ref 80.0–100.0)
MONOS PCT: 9 %
Monocytes Absolute: 0.8 10*3/uL (ref 0.2–0.9)
NEUTROS PCT: 57 %
Neutro Abs: 5.2 10*3/uL (ref 1.4–6.5)
Platelets: 249 10*3/uL (ref 150–440)
RBC: 5.23 MIL/uL — ABNORMAL HIGH (ref 3.80–5.20)
RDW: 15 % — ABNORMAL HIGH (ref 11.5–14.5)
WBC: 9 10*3/uL (ref 3.6–11.0)

## 2016-11-24 LAB — COMPREHENSIVE METABOLIC PANEL
ALK PHOS: 109 U/L (ref 38–126)
ALT: 17 U/L (ref 14–54)
ANION GAP: 10 (ref 5–15)
AST: 29 U/L (ref 15–41)
Albumin: 4.1 g/dL (ref 3.5–5.0)
BILIRUBIN TOTAL: 0.9 mg/dL (ref 0.3–1.2)
BUN: 31 mg/dL — ABNORMAL HIGH (ref 6–20)
CALCIUM: 9.6 mg/dL (ref 8.9–10.3)
CO2: 26 mmol/L (ref 22–32)
CREATININE: 1.06 mg/dL — AB (ref 0.44–1.00)
Chloride: 101 mmol/L (ref 101–111)
GFR calc non Af Amer: 47 mL/min — ABNORMAL LOW (ref >60)
GFR, EST AFRICAN AMERICAN: 54 mL/min — AB (ref >60)
Glucose, Bld: 128 mg/dL — ABNORMAL HIGH (ref 65–99)
Potassium: 3.8 mmol/L (ref 3.5–5.1)
SODIUM: 137 mmol/L (ref 135–145)
TOTAL PROTEIN: 7.3 g/dL (ref 6.5–8.1)

## 2016-11-24 MED ORDER — ANASTROZOLE 1 MG PO TABS: 1.0000 mg | ORAL_TABLET | Freq: Every day | ORAL | 3 refills | Status: DC

## 2016-11-24 NOTE — Assessment & Plan Note (Addendum)
#  RIGHT BREAST CA stage I ER/PR positive HER-2/neu negative on adjuvant Arimidex.  #  Clinically no evidence of recurrence. Patient recent mammogram October 2017-normal. Again due for mammogram in October 2018/follow-up with Dr. Smith.  # OSTEOPENIA [BMD- 2017]-Continue calcium and vitamin D.  # Follow-up with us 6 months with MD/labs.  

## 2016-11-24 NOTE — Progress Notes (Signed)
[facility] OFFICE PROGRESS NOTE  Patient Care Team: Sharyne Peach, MD as PCP - General (Family Medicine)   SUMMARY OF ONCOLOGIC HISTORY:  Oncology History   # AUG 2015-RIGHT BREAST MAMMARY CA- MICROINVASIVE [48m]; STAGE I [pT151m; pN0 s/p Lumpec & SLNBx; Dr.Smith] ER/PR > 90%; Her 2 NEG s/p RT; Arimidex     # BMD- Oct 2017- Osteopenia.      Ductal carcinoma in situ (DCIS) of right breast    Carcinoma of overlapping sites of right breast in female, estrogen receptor positive ([facility]     INTERVAL HISTORY: [age]ear old female patient with above history of stage I ER/PR positive HER-2/neu negative breast cancer on adjuvant Arimidex   Denies any unusual aches and pains. Appetite is good. No weight loss. No shortness of breath. No cough. No bone pain. No arthritic pain.  Patient has been compliant with her Arimidex.   REVIEW OF SYSTEMS:  A complete 10 point review of system is done which is negative except mentioned above/history of present illness.   PAST MEDICAL HISTORY :  Past Medical History:  Diagnosis Date  . Arthritis   . Breast cancer ([facility]- right   invasive mammary carcinoma--ER/PR+, Her2neu- radiation  . Chronic back pain   . Chronic back pain   . Diabetes mellitus without complication ([facility]  . Gout   . Hypertension   . Osteoarthritis     PAST SURGICAL HISTORY :   Past Surgical History:  Procedure Laterality Date  . ABDOMINAL HYSTERECTOMY    . APPENDECTOMY    . BREAST BIOPSY Right 2015   +  . BREAST BIOPSY Bilateral    neg  . BREAST LUMPECTOMY    . CHOLECYSTECTOMY    . KNEE ARTHROSCOPY Left     FAMILY HISTORY :   Family History  Problem Relation Age of Onset  . Liver cancer Mother        liver  . Heart disease Mother   . Stroke Father   . Liver cancer Sister   . Breast cancer Paternal Aunt   . Breast cancer Paternal Aunt     SOCIAL HISTORY:   Social History  Substance Use Topics  . Smoking status: Never Smoker  .  Smokeless tobacco: Never Used  . Alcohol use No    ALLERGIES:  is allergic to aspirin and gabapentin.  MEDICATIONS:  Current Outpatient Prescriptions  Medication Sig Dispense Refill  . allopurinol (ZYLOPRIM) 100 MG tablet Take 100 mg by mouth at bedtime.    . Marland Kitchennastrozole (ARIMIDEX) 1 MG tablet Take 1 tablet (1 mg total) by mouth daily. 90 tablet 3  . atenolol (TENORMIN) 100 MG tablet Take 100 mg by mouth daily.    . Calcium Carb-Cholecalciferol (CALCIUM 600 + D PO) Take 2 tablets by mouth daily.     . celecoxib (CELEBREX) 200 MG capsule Take 200 mg by mouth at bedtime.    . clobetasol cream (TEMOVATE) 0.5.17 Apply 1 application topically 2 (two) times daily.    . colchicine 0.6 MG tablet Take 0.6 mg by mouth as needed.     . Marland KitchenLUoxetine (PROZAC) 20 MG capsule Take 20 mg by mouth at bedtime.    . Marland Kitchenisinopril-hydrochlorothiazide (PRINZIDE,ZESTORETIC) 20-25 MG per tablet Take 1 tablet by mouth at bedtime.    . metFORMIN (GLUCOPHAGE) 500 MG tablet Take 500 mg by mouth at bedtime.    . Marland KitchenIFEdipine (PROCARDIA-XL/ADALAT-CC/NIFEDICAL-XL) 30 MG 24 hr tablet Take 30 mg by mouth at bedtime.    .Marland Kitchen  oxybutynin (DITROPAN) 5 MG tablet Take 5 mg by mouth 2 (two) times daily.     Current Facility-Administered Medications  Medication Dose Route Frequency Provider Last Rate Last Dose  . ceFAZolin (ANCEF) IVPB 1 g/50 mL premix  1 g Intravenous Once Mohammed Kindle, MD        PHYSICAL EXAMINATION: ECOG PERFORMANCE STATUS: 0 - Asymptomatic  BP 128/60 (BP Location: Right Arm, Patient Position: Sitting)   Pulse 80   Temp 97.6 F (36.4 C) (Tympanic)   Resp 20   Ht 5' (1.524 m)   Wt 180 lb (81.6 kg)   BMI 35.15 kg/m   Filed Weights   11/24/16 1449  Weight: 180 lb (81.6 kg)    GENERAL: Well-nourished well-developed; Alert, no distress and comfortable.   Alone.  EYES: no pallor or icterus OROPHARYNX: no thrush or ulceration; good dentition  NECK: supple, no masses felt LYMPH:  no palpable  lymphadenopathy in the cervical, axillary or inguinal regions LUNGS: clear to auscultation and  No wheeze or crackles HEART/CVS: regular rate & rhythm and no murmurs; No lower extremity edema BREAST: Breasts were examined in 2 positions, no masses or nodularity present in either breast. Axilla also examined with no abnormal findings. ABDOMEN:abdomen soft, non-tender and normal bowel sounds Musculoskeletal:no cyanosis of digits and no clubbing  PSYCH: alert & oriented x 3 with fluent speech NEURO: no focal motor/sensory deficits SKIN:  no rashes or significant lesions  LABORATORY DATA:  I have reviewed the data as listed    Component Value Date/Time   NA 137 11/24/2016 1415   NA 139 12/22/2013 1457   K 3.8 11/24/2016 1415   K 4.3 12/22/2013 1457   CL 101 11/24/2016 1415   CL 104 12/22/2013 1457   CO2 26 11/24/2016 1415   CO2 29 12/22/2013 1457   GLUCOSE 128 (H) 11/24/2016 1415   GLUCOSE 83 12/22/2013 1457   BUN 31 (H) 11/24/2016 1415   BUN 37 (H) 12/22/2013 1457   CREATININE 1.06 (H) 11/24/2016 1415   CREATININE 1.10 12/22/2013 1457   CALCIUM 9.6 11/24/2016 1415   CALCIUM 9.9 12/22/2013 1457   PROT 7.3 11/24/2016 1415   ALBUMIN 4.1 11/24/2016 1415   AST 29 11/24/2016 1415   ALT 17 11/24/2016 1415   ALKPHOS 109 11/24/2016 1415   BILITOT 0.9 11/24/2016 1415   GFRNONAA 47 (L) 11/24/2016 1415   GFRNONAA 47 (L) 12/22/2013 1457   GFRAA 54 (L) 11/24/2016 1415   GFRAA 55 (L) 12/22/2013 1457    No results found for: SPEP, UPEP  Lab Results  Component Value Date   WBC 9.0 11/24/2016   NEUTROABS 5.2 11/24/2016   HGB 14.6 11/24/2016   HCT 42.7 11/24/2016   MCV 81.8 11/24/2016   PLT 249 11/24/2016      Chemistry      Component Value Date/Time   NA 137 11/24/2016 1415   NA 139 12/22/2013 1457   K 3.8 11/24/2016 1415   K 4.3 12/22/2013 1457   CL 101 11/24/2016 1415   CL 104 12/22/2013 1457   CO2 26 11/24/2016 1415   CO2 29 12/22/2013 1457   BUN 31 (H) 11/24/2016 1415    BUN 37 (H) 12/22/2013 1457   CREATININE 1.06 (H) 11/24/2016 1415   CREATININE 1.10 12/22/2013 1457      Component Value Date/Time   CALCIUM 9.6 11/24/2016 1415   CALCIUM 9.9 12/22/2013 1457   ALKPHOS 109 11/24/2016 1415   AST 29 11/24/2016 1415   ALT 17  11/24/2016 1415   BILITOT 0.9 11/24/2016 1415       RADIOGRAPHIC STUDIES: I have personally reviewed the radiological images as listed and agreed with the findings in the report. No results found.   ASSESSMENT & PLAN:    Ductal carcinoma in situ (DCIS) of right breast      Carcinoma of overlapping sites of right breast in female, estrogen receptor positive ([place])  # RIGHT BREAST CA stage I ER/PR positive HER-2/neu negative on adjuvant Arimidex.  #  Clinically no evidence of recurrence. Patient recent mammogram October 2017-normal. Again due for mammogram in October 2018/follow-up with Dr. Tamala Julian.  # OSTEOPENIA [BMD- 2017]-Continue calcium and vitamin D.  # Follow-up with [place] 6 months with MD/labs.  Faythe Casa, NP    Cammie Sickle, MD 11/24/2016 9:00 PM

## 2017-05-28 ENCOUNTER — Inpatient Hospital Stay: Payer: Medicare Other

## 2017-05-28 ENCOUNTER — Inpatient Hospital Stay: Payer: Medicare Other | Admitting: Internal Medicine

## 2017-05-28 ENCOUNTER — Ambulatory Visit: Payer: Medicare Other | Admitting: Radiation Oncology

## 2017-05-28 NOTE — Assessment & Plan Note (Deleted)
#  RIGHT BREAST CA stage I ER/PR positive HER-2/neu negative on adjuvant Arimidex.  #  Clinically no evidence of recurrence. Patient recent mammogram October 2017-normal. Again due for mammogram in October 2018/follow-up with Dr. Tamala Julian.  # OSTEOPENIA [BMD- 2017]-Continue calcium and vitamin D.  # Follow-up with [place] 6 months with MD/labs.

## 2017-05-28 NOTE — Progress Notes (Deleted)
[facility] OFFICE PROGRESS NOTE  Patient Care Team: Sharyne Peach, MD as PCP - General (Family Medicine)   SUMMARY OF ONCOLOGIC HISTORY:  Oncology History   # AUG 2015-RIGHT BREAST MAMMARY CA- MICROINVASIVE [44m]; STAGE I [pT161m; pN0 s/p Lumpec & SLNBx; Dr.Smith] ER/PR > 90%; Her 2 NEG s/p RT; Arimidex     # BMD- Oct 2017- Osteopenia.      Ductal carcinoma in situ (DCIS) of right breast    Carcinoma of overlapping sites of right breast in female, estrogen receptor positive ([facility]     INTERVAL HISTORY: [age]ear old female patient with above history of stage I ER/PR positive HER-2/neu negative breast cancer on adjuvant Arimidex   Denies any unusual aches and pains. Appetite is good. No weight loss. No shortness of breath. No cough. No bone pain. No arthritic pain.  Patient has been compliant with her Arimidex.   REVIEW OF SYSTEMS:  A complete 10 point review of system is done which is negative except mentioned above/history of present illness.   PAST MEDICAL HISTORY :  Past Medical History:  Diagnosis Date  . Arthritis   . Breast cancer ([facility]2015- right   invasive mammary carcinoma--ER/PR+, Her2neu- radiation  . Chronic back pain   . Chronic back pain   . [place] without complication ([place]  . Gout   . Hypertension   . Osteoarthritis     PAST SURGICAL HISTORY :   Past Surgical History:  Procedure Laterality Date  . ABDOMINAL HYSTERECTOMY    . APPENDECTOMY    . BREAST BIOPSY Right 2015   +  . BREAST BIOPSY Bilateral    neg  . BREAST LUMPECTOMY    . CHOLECYSTECTOMY    . KNEE ARTHROSCOPY Left     FAMILY HISTORY :   Family History  Problem Relation Age of Onset  . Liver cancer Mother        liver  . Heart disease Mother   . Stroke Father   . Liver cancer Sister   . Breast cancer Paternal Aunt   . Breast cancer Paternal Aunt     SOCIAL HISTORY:   Social History   Tobacco Use  . Smoking status: Never Smoker  . Smokeless  tobacco: Never Used  Substance Use Topics  . Alcohol use: No    Alcohol/week: 0.0 oz  . Drug use: No    ALLERGIES:  is allergic to aspirin and gabapentin.  MEDICATIONS:  Current Outpatient Medications  Medication Sig Dispense Refill  . allopurinol (ZYLOPRIM) 100 MG tablet Take 100 mg by mouth at bedtime.    . Marland Kitchennastrozole (ARIMIDEX) 1 MG tablet Take 1 tablet (1 mg total) by mouth daily. 90 tablet 3  . atenolol (TENORMIN) 100 MG tablet Take 100 mg by mouth daily.    . Calcium Carb-Cholecalciferol (CALCIUM 600 + D PO) Take 2 tablets by mouth daily.     . celecoxib (CELEBREX) 200 MG capsule Take 200 mg by mouth at bedtime.    . clobetasol cream (TEMOVATE) 0.1.22 Apply 1 application topically 2 (two) times daily.    . colchicine 0.6 MG tablet Take 0.6 mg by mouth as needed.     . Marland KitchenLUoxetine (PROZAC) 20 MG capsule Take 20 mg by mouth at bedtime.    . Marland Kitchenisinopril-hydrochlorothiazide (PRINZIDE,ZESTORETIC) 20-25 MG per tablet Take 1 tablet by mouth at bedtime.    . metFORMIN (GLUCOPHAGE) 500 MG tablet Take 500 mg by mouth at bedtime.    .Marland Kitchen  NIFEdipine (PROCARDIA-XL/ADALAT-CC/NIFEDICAL-XL) 30 MG 24 hr tablet Take 30 mg by mouth at bedtime.    Marland Kitchen oxybutynin (DITROPAN) 5 MG tablet Take 5 mg by mouth 2 (two) times daily.     Current Facility-Administered Medications  Medication Dose Route Frequency Provider Last Rate Last Dose  . ceFAZolin (ANCEF) IVPB 1 g/50 mL premix  1 g Intravenous Once Mohammed Kindle, MD        PHYSICAL EXAMINATION: ECOG PERFORMANCE STATUS: 0 - Asymptomatic  There were no vitals taken for this visit.  There were no vitals filed for this visit.  GENERAL: Well-nourished well-developed; Alert, no distress and comfortable.   Alone.  EYES: no pallor or icterus OROPHARYNX: no thrush or ulceration; good dentition  NECK: supple, no masses felt LYMPH:  no palpable lymphadenopathy in the cervical, axillary or inguinal regions LUNGS: clear to auscultation and  No wheeze or  crackles HEART/CVS: regular rate & rhythm and no murmurs; No lower extremity edema BREAST: Breasts were examined in 2 positions, no masses or nodularity present in either breast. Axilla also examined with no abnormal findings. ABDOMEN:abdomen soft, non-tender and normal bowel sounds Musculoskeletal:no cyanosis of digits and no clubbing  PSYCH: alert & oriented x 3 with fluent speech NEURO: no focal motor/sensory deficits SKIN:  no rashes or significant lesions  LABORATORY DATA:  I have reviewed the data as listed    Component Value Date/Time   NA 137 11/24/2016 1415   NA 139 12/22/2013 1457   K 3.8 11/24/2016 1415   K 4.3 12/22/2013 1457   CL 101 11/24/2016 1415   CL 104 12/22/2013 1457   CO2 26 11/24/2016 1415   CO2 29 12/22/2013 1457   GLUCOSE 128 (H) 11/24/2016 1415   GLUCOSE 83 12/22/2013 1457   BUN 31 (H) 11/24/2016 1415   BUN 37 (H) 12/22/2013 1457   CREATININE 1.06 (H) 11/24/2016 1415   CREATININE 1.10 12/22/2013 1457   CALCIUM 9.6 11/24/2016 1415   CALCIUM 9.9 12/22/2013 1457   PROT 7.3 11/24/2016 1415   ALBUMIN 4.1 11/24/2016 1415   AST 29 11/24/2016 1415   ALT 17 11/24/2016 1415   ALKPHOS 109 11/24/2016 1415   BILITOT 0.9 11/24/2016 1415   GFRNONAA 47 (L) 11/24/2016 1415   GFRNONAA 47 (L) 12/22/2013 1457   GFRAA 54 (L) 11/24/2016 1415   GFRAA 55 (L) 12/22/2013 1457    No results found for: SPEP, UPEP  Lab Results  Component Value Date   WBC 9.0 11/24/2016   NEUTROABS 5.2 11/24/2016   HGB 14.6 11/24/2016   HCT 42.7 11/24/2016   MCV 81.8 11/24/2016   PLT 249 11/24/2016      Chemistry      Component Value Date/Time   NA 137 11/24/2016 1415   NA 139 12/22/2013 1457   K 3.8 11/24/2016 1415   K 4.3 12/22/2013 1457   CL 101 11/24/2016 1415   CL 104 12/22/2013 1457   CO2 26 11/24/2016 1415   CO2 29 12/22/2013 1457   BUN 31 (H) 11/24/2016 1415   BUN 37 (H) 12/22/2013 1457   CREATININE 1.06 (H) 11/24/2016 1415   CREATININE 1.10 12/22/2013 1457       Component Value Date/Time   CALCIUM 9.6 11/24/2016 1415   CALCIUM 9.9 12/22/2013 1457   ALKPHOS 109 11/24/2016 1415   AST 29 11/24/2016 1415   ALT 17 11/24/2016 1415   BILITOT 0.9 11/24/2016 1415       RADIOGRAPHIC STUDIES: I have personally reviewed the radiological images as  listed and agreed with the findings in the report. No results found.   ASSESSMENT & PLAN:    No problem-specific Assessment & Plan notes found for this encounter. Faythe Casa, NP    Cammie Sickle, MD 05/28/2017 8:33 AM

## 2017-06-12 ENCOUNTER — Other Ambulatory Visit: Payer: Self-pay | Admitting: Family Medicine

## 2017-06-12 DIAGNOSIS — Z170 Estrogen receptor positive status [ER+]: Principal | ICD-10-CM

## 2017-06-12 DIAGNOSIS — C50011 Malignant neoplasm of nipple and areola, right female breast: Secondary | ICD-10-CM

## 2017-06-18 ENCOUNTER — Encounter: Payer: Self-pay | Admitting: *Deleted

## 2017-07-09 ENCOUNTER — Ambulatory Visit
Admission: RE | Admit: 2017-07-09 | Discharge: 2017-07-09 | Disposition: A | Discharge status: Home / Self Care | Payer: Medicare Other | Source: Ambulatory Visit | Attending: Radiation Oncology | Admitting: Radiation Oncology

## 2017-07-09 ENCOUNTER — Other Ambulatory Visit: Payer: Self-pay

## 2017-07-09 ENCOUNTER — Inpatient Hospital Stay: Payer: Medicare Other | Attending: Radiation Oncology

## 2017-07-09 ENCOUNTER — Inpatient Hospital Stay: Payer: Medicare Other | Admitting: Internal Medicine

## 2017-07-09 VITALS — BP 164/74 | HR 72 | Temp 97.8°F | Resp 20 | Ht 60.0 in | Wt 176.6 lb

## 2017-07-09 DIAGNOSIS — D0511 Intraductal carcinoma in situ of right breast: Secondary | ICD-10-CM

## 2017-07-09 DIAGNOSIS — Z923 Personal history of irradiation: Secondary | ICD-10-CM | POA: Insufficient documentation

## 2017-07-09 DIAGNOSIS — Z170 Estrogen receptor positive status [ER+]: Secondary | ICD-10-CM | POA: Insufficient documentation

## 2017-07-09 DIAGNOSIS — Z803 Family history of malignant neoplasm of breast: Secondary | ICD-10-CM

## 2017-07-09 DIAGNOSIS — Z7984 Long term (current) use of oral hypoglycemic drugs: Secondary | ICD-10-CM | POA: Diagnosis not present

## 2017-07-09 DIAGNOSIS — M1990 Unspecified osteoarthritis, unspecified site: Secondary | ICD-10-CM | POA: Insufficient documentation

## 2017-07-09 DIAGNOSIS — Z79811 Long term (current) use of aromatase inhibitors: Secondary | ICD-10-CM

## 2017-07-09 DIAGNOSIS — M8580 Other specified disorders of bone density and structure, unspecified site: Secondary | ICD-10-CM | POA: Diagnosis not present

## 2017-07-09 DIAGNOSIS — M129 Arthropathy, unspecified: Secondary | ICD-10-CM | POA: Diagnosis not present

## 2017-07-09 DIAGNOSIS — Z79899 Other long term (current) drug therapy: Secondary | ICD-10-CM

## 2017-07-09 DIAGNOSIS — E119 Type 2 diabetes mellitus without complications: Secondary | ICD-10-CM | POA: Diagnosis not present

## 2017-07-09 DIAGNOSIS — Z800 Family history of malignant neoplasm of digestive organs: Secondary | ICD-10-CM | POA: Diagnosis not present

## 2017-07-09 DIAGNOSIS — I10 Essential (primary) hypertension: Secondary | ICD-10-CM | POA: Insufficient documentation

## 2017-07-09 DIAGNOSIS — C50811 Malignant neoplasm of overlapping sites of right female breast: Secondary | ICD-10-CM | POA: Insufficient documentation

## 2017-07-09 DIAGNOSIS — C50911 Malignant neoplasm of unspecified site of right female breast: Secondary | ICD-10-CM

## 2017-07-09 LAB — CBC WITH DIFFERENTIAL/PLATELET
BASOS ABS: 0.1 10*3/uL (ref 0–0.1)
BASOS PCT: 1 %
EOS ABS: 0.2 10*3/uL (ref 0–0.7)
Eosinophils Relative: 2 %
HEMATOCRIT: 41.6 % (ref 35.0–47.0)
HEMOGLOBIN: 14 g/dL (ref 12.0–16.0)
Lymphocytes Relative: 30 %
Lymphs Abs: 2.6 10*3/uL (ref 1.0–3.6)
MCH: 27.6 pg (ref 26.0–34.0)
MCHC: 33.7 g/dL (ref 32.0–36.0)
MCV: 81.8 fL (ref 80.0–100.0)
MONOS PCT: 10 %
Monocytes Absolute: 0.9 10*3/uL (ref 0.2–0.9)
NEUTROS ABS: 4.9 10*3/uL (ref 1.4–6.5)
NEUTROS PCT: 57 %
Platelets: 243 10*3/uL (ref 150–440)
RBC: 5.09 MIL/uL (ref 3.80–5.20)
RDW: 15.9 % — ABNORMAL HIGH (ref 11.5–14.5)
WBC: 8.7 10*3/uL (ref 3.6–11.0)

## 2017-07-09 LAB — COMPREHENSIVE METABOLIC PANEL
ALBUMIN: 3.8 g/dL (ref 3.5–5.0)
ALK PHOS: 109 U/L (ref 38–126)
ALT: 14 U/L (ref 14–54)
AST: 21 U/L (ref 15–41)
Anion gap: 8 (ref 5–15)
BILIRUBIN TOTAL: 0.9 mg/dL (ref 0.3–1.2)
BUN: 26 mg/dL — AB (ref 6–20)
CALCIUM: 9.4 mg/dL (ref 8.9–10.3)
CO2: 25 mmol/L (ref 22–32)
Chloride: 105 mmol/L (ref 101–111)
Creatinine, Ser: 0.91 mg/dL (ref 0.44–1.00)
GFR calc Af Amer: >60 mL/min (ref >60)
GFR calc non Af Amer: 56 mL/min — ABNORMAL LOW (ref >60)
GLUCOSE: 113 mg/dL — AB (ref 65–99)
Potassium: 4.7 mmol/L (ref 3.5–5.1)
Sodium: 138 mmol/L (ref 135–145)
TOTAL PROTEIN: 7.2 g/dL (ref 6.5–8.1)

## 2017-07-09 NOTE — Progress Notes (Signed)
[facility] OFF[facility] PROGRESS NOTE  Patient Care Team: Sharyne Peach, MD as PCP - General (Family Medicine)   SUMMARY OF ONCOLOGIC HISTORY:  Oncology History   # AUG 2015-RIGHT BREAST MAMMARY CA- MICROINVASIVE [94m]; STAGE I [pT139m; pN0 s/p Lumpec & SLNBx; Dr.Smith] ER/PR > 90%; Her 2 NEG s/p RT; Arimidex     # BMD- Oct 2017- Osteopenia.      Ductal carcinoma in situ (DCIS) of right breast (Resolved)    Carcinoma of overlapping sites of right breast in female, estrogen receptor positive ([place]     INTERVAL HISTORY: [age]ear old female patient with above history of stage I ER/PR positive HER-2/neu negative breast cancer on adjuvant Arimidex.   Denies any unusual aches and pains. Appetite is good. No weight loss. No shortness of breath. No cough. No bone pain. No arthritic pain.  REVIEW OF SYSTEMS:  A complete 10 point review of system is done which is negative except mentioned above/history of present illness.   PAST MEDICAL HISTORY :  Past Medical History:  Diagnosis Date  . Arthritis   . Breast cancer ([place]- [place]   invasive mammary carcinoma--ER/PR+, Her2neu- radiation  . Chronic back pain   . Chronic back pain   . Diabetes mellitus without complication ([facility]  . Gout   . Hypertension   . Osteoarthritis     PAST SURGICAL HISTORY :   Past Surgical History:  Procedure Laterality Date  . ABDOMINAL HYSTERECTOMY    . APPENDECTOMY    . BREAST BIOPSY Right 2015   +  . BREAST BIOPSY Bilateral    neg  . BREAST LUMPECTOMY    . CHOLECYSTECTOMY    . KNEE ARTHROSCOPY Left     FAMILY HISTORY :   Family History  Problem Relation Age of Onset  . Liver cancer Mother        liver  . Heart disease Mother   . Stroke Father   . Liver cancer Sister   . Breast cancer Paternal Aunt   . Breast cancer Paternal Aunt     SOCIAL HISTORY:   Social History   Tobacco Use  . Smoking status: Never Smoker  . Smokeless tobacco: Never Used  Substance Use  Topics  . Alcohol use: No    Alcohol/week: 0.0 oz  . Drug use: No    ALLERGIES:  is allergic to lisinopril-hydrochlorothiazide; aspirin; and gabapentin.  MEDICATIONS:  Current Outpatient Medications  Medication Sig Dispense Refill  . allopurinol (ZYLOPRIM) 100 MG tablet Take 100 mg by mouth at bedtime.    . Marland Kitchennastrozole (ARIMIDEX) 1 MG tablet Take 1 tablet (1 mg total) by mouth daily. 90 tablet 3  . atenolol (TENORMIN) 100 MG tablet Take 100 mg by mouth daily.    . Calcium Carb-Cholecalciferol (CALCIUM 600 + D PO) Take 2 tablets by mouth daily.     . celecoxib (CELEBREX) 200 MG capsule Take 200 mg by mouth at bedtime.    . clobetasol cream (TEMOVATE) 0.5.10 Apply 1 application topically 2 (two) times daily.    . Marland KitchenLUoxetine (PROZAC) 20 MG capsule Take 20 mg by mouth at bedtime.    . metFORMIN (GLUCOPHAGE) 500 MG tablet Take 500 mg by mouth at bedtime.    . Marland KitchenIFEdipine (PROCARDIA-XL/ADALAT-CC/NIFEDICAL-XL) 30 MG 24 hr tablet Take 30 mg by mouth at bedtime.    . Marland Kitchenxybutynin (DITROPAN) 5 MG tablet Take 5 mg by mouth 2 (two) times daily.    . colchicine 0.6 MG tablet  Take 0.6 mg by mouth as needed.      Current Facility-Administered Medications  Medication Dose Route Frequency Provider Last Rate Last Dose  . ceFAZolin (ANCEF) IVPB 1 g/50 mL premix  1 g Intravenous Once Mohammed Kindle, MD        PHYSICAL EXAMINATION: ECOG PERFORMANCE STATUS: 0 - Asymptomatic  BP (!) 164/74   Pulse 72   Temp 97.8 F (36.6 C) (Tympanic)   Resp 20   Ht 5' (1.524 m)   Wt 176 lb 9.6 oz (80.1 kg)   BMI 34.49 kg/m   Filed Weights   07/09/17 1424  Weight: 176 lb 9.6 oz (80.1 kg)    GENERAL: Well-nourished well-developed; Alert, no distress and comfortable.   Alone.  EYES: no pallor or icterus OROPHARYNX: no thrush or ulceration; good dentition  NECK: supple, no masses felt LYMPH:  no palpable lymphadenopathy in the cervical, axillary or inguinal regions LUNGS: clear to auscultation and  No wheeze or  crackles HEART/CVS: regular rate & rhythm and no murmurs; No lower extremity edema ABDOMEN:abdomen soft, non-tender and normal bowel sounds Musculoskeletal:no cyanosis of digits and no clubbing  PSYCH: alert & oriented x 3 with fluent speech NEURO: no focal motor/sensory deficits SKIN:  no rashes or significant lesions Right and left BREAST exam [in the presence of nurse]- no unusual skin changes or dominant masses felt. Surgical scars noted.    LABORATORY DATA:  I have reviewed the data as listed    Component Value Date/Time   NA 138 07/09/2017 1339   NA 139 12/22/2013 1457   K 4.7 07/09/2017 1339   K 4.3 12/22/2013 1457   CL 105 07/09/2017 1339   CL 104 12/22/2013 1457   CO2 25 07/09/2017 1339   CO2 29 12/22/2013 1457   GLUCOSE 113 (H) 07/09/2017 1339   GLUCOSE 83 12/22/2013 1457   BUN 26 (H) 07/09/2017 1339   BUN 37 (H) 12/22/2013 1457   CREATININE 0.91 07/09/2017 1339   CREATININE 1.10 12/22/2013 1457   CALCIUM 9.4 07/09/2017 1339   CALCIUM 9.9 12/22/2013 1457   PROT 7.2 07/09/2017 1339   ALBUMIN 3.8 07/09/2017 1339   AST 21 07/09/2017 1339   ALT 14 07/09/2017 1339   ALKPHOS 109 07/09/2017 1339   BILITOT 0.9 07/09/2017 1339   GFRNONAA 56 (L) 07/09/2017 1339   GFRNONAA 47 (L) 12/22/2013 1457   GFRAA >60 07/09/2017 1339   GFRAA 55 (L) 12/22/2013 1457    No results found for: SPEP, UPEP  Lab Results  Component Value Date   WBC 8.7 07/09/2017   NEUTROABS 4.9 07/09/2017   HGB 14.0 07/09/2017   HCT 41.6 07/09/2017   MCV 81.8 07/09/2017   PLT 243 07/09/2017      Chemistry      Component Value Date/Time   NA 138 07/09/2017 1339   NA 139 12/22/2013 1457   K 4.7 07/09/2017 1339   K 4.3 12/22/2013 1457   CL 105 07/09/2017 1339   CL 104 12/22/2013 1457   CO2 25 07/09/2017 1339   CO2 29 12/22/2013 1457   BUN 26 (H) 07/09/2017 1339   BUN 37 (H) 12/22/2013 1457   CREATININE 0.91 07/09/2017 1339   CREATININE 1.10 12/22/2013 1457      Component Value Date/Time    CALCIUM 9.4 07/09/2017 1339   CALCIUM 9.9 12/22/2013 1457   ALKPHOS 109 07/09/2017 1339   AST 21 07/09/2017 1339   ALT 14 07/09/2017 1339   BILITOT 0.9 07/09/2017 1339  RADIOGRAPHIC STUDIES: I have personally reviewed the radiological images as listed and agreed with the findings in the report. No results found.   ASSESSMENT & PLAN:    Carcinoma of overlapping sites of right breast in female, estrogen receptor positive ([facility])  # RIGHT BREAST CA stage I ER/PR positive HER-2/neu negative on adjuvant Arimidex [until jan 2021]  #  Clinically no evidence of recurrence. Patient recent mammogram October 2017-normal. Awaiting mammogram march 2019.  # OSTEOPENIA [BMD- 2017]-Continue calcium and vitamin D. Repeat with mammogram in march 2019. Discussed re: exercise/ medication for osteoporosis.  # Follow-up with [place] 6 months with MD/labs.  Faythe Casa, NP    Cammie Sickle, MD 07/15/2017 9:24 AM

## 2017-07-09 NOTE — Assessment & Plan Note (Addendum)
#  RIGHT BREAST CA stage I ER/PR positive HER-2/neu negative on adjuvant Arimidex [until jan 2021]  #  Clinically no evidence of recurrence. Patient recent mammogram October 2017-normal. Awaiting mammogram march 2019.  # OSTEOPENIA [BMD- 2017]-Continue calcium and vitamin D. Repeat with mammogram in march 2019. Discussed re: exercise/ medication for osteoporosis.  # Follow-up with [place] 6 months with MD/labs.

## 2017-07-09 NOTE — Progress Notes (Signed)
RN Chaperoned provider with Breast Exam.   

## 2017-07-09 NOTE — Progress Notes (Signed)
Radiation Oncology Follow up Note  Name: [facility]   Date:   07/09/2017 MRN:  [id number] DOB: 04-04-1931    This 84 y.o. female presents to the clinic today for a 3 year follow-up status post whole breast radiation to her right breast for stage I invasive mammary carcinoma.  REFERRING PROVIDER: Sharyne Peach, MD  HPI: Patient is a 84 year old female now seen out 3 years having completed radiation therapy to her right breast for stage I ER/PR positive invasive mammary carcinoma seen today in routine follow-up she is doing well. She specifically denies breast tenderness cough or bone pain. She's had mammograms back in 2017 has scheduled for repeat mammograms this month. She's currently on arimadex tongue that well without side effect.  COMPLICATIONS OF TREATMENT: none  FOLLOW UP COMPLIANCE: keeps appointments   PHYSICAL EXAM:  There were no vitals taken for this visit. Lungs are clear to A&P cardiac examination essentially unremarkable with regular rate and rhythm. No dominant mass or nodularity is noted in either breast in 2 positions examined. Incision is well-healed. No axillary or supraclavicular adenopathy is appreciated. Cosmetic result is excellent. Well-developed well-nourished patient in NAD. HEENT reveals PERLA, EOMI, discs not visualized.  Oral cavity is clear. No oral mucosal lesions are identified. Neck is clear without evidence of cervical or supraclavicular adenopathy. Lungs are clear to A&P. Cardiac examination is essentially unremarkable with regular rate and rhythm without murmur rub or thrill. Abdomen is benign with no organomegaly or masses noted. Motor sensory and DTR levels are equal and symmetric in the upper and lower extremities. Cranial nerves II through XII are grossly intact. Proprioception is intact. No peripheral adenopathy or edema is identified. No motor or sensory levels are noted. Crude visual fields are within normal range.  RADIOLOGY RESULTS: No current  films for review  PLAN: Present time patient is doing well with no evidence of disease. I'm please were overall progress. I've asked to see her back in 1 year for follow-up. She continues on arimadex without side effect. She will follow-up mammograms this month. Patient knows to call with any concerns.  I would like to take this opportunity to thank you for allowing me to participate in the care of your patient.Noreene Filbert, MD

## 2017-07-11 ENCOUNTER — Ambulatory Visit: Payer: Medicare Other | Admitting: Radiation Oncology

## 2017-07-20 ENCOUNTER — Ambulatory Visit
Admission: RE | Admit: 2017-07-20 | Discharge: 2017-07-20 | Disposition: A | Discharge status: Home / Self Care | Payer: Medicare Other | Source: Ambulatory Visit | Attending: Family Medicine | Admitting: Family Medicine

## 2017-07-20 DIAGNOSIS — Z853 Personal history of malignant neoplasm of breast: Secondary | ICD-10-CM | POA: Insufficient documentation

## 2017-07-20 DIAGNOSIS — Z08 Encounter for follow-up examination after completed treatment for malignant neoplasm: Secondary | ICD-10-CM | POA: Diagnosis not present

## 2017-07-20 DIAGNOSIS — C50011 Malignant neoplasm of nipple and areola, right female breast: Secondary | ICD-10-CM

## 2017-07-20 DIAGNOSIS — Z170 Estrogen receptor positive status [ER+]: Principal | ICD-10-CM

## 2017-07-20 HISTORY — DX: Personal history of irradiation: Z92.3

## 2017-08-01 ENCOUNTER — Ambulatory Visit
Admission: RE | Admit: 2017-08-01 | Discharge: 2017-08-01 | Disposition: A | Discharge status: Home / Self Care | Payer: Medicare Other | Source: Ambulatory Visit | Attending: Internal Medicine | Admitting: Internal Medicine

## 2017-08-01 DIAGNOSIS — C50811 Malignant neoplasm of overlapping sites of right female breast: Secondary | ICD-10-CM | POA: Diagnosis not present

## 2017-08-01 DIAGNOSIS — M8589 Other specified disorders of bone density and structure, multiple sites: Secondary | ICD-10-CM | POA: Insufficient documentation

## 2017-08-01 DIAGNOSIS — Z170 Estrogen receptor positive status [ER+]: Secondary | ICD-10-CM | POA: Insufficient documentation

## 2017-08-01 DIAGNOSIS — Z79811 Long term (current) use of aromatase inhibitors: Secondary | ICD-10-CM | POA: Diagnosis not present

## 2017-10-22 ENCOUNTER — Other Ambulatory Visit: Payer: Self-pay

## 2017-10-22 ENCOUNTER — Encounter: Payer: Self-pay | Admitting: Emergency Medicine

## 2017-10-22 ENCOUNTER — Emergency Department: Payer: Medicare Other

## 2017-10-22 ENCOUNTER — Emergency Department
Admission: EM | Admit: 2017-10-22 | Discharge: 2017-10-22 | Disposition: A | Discharge status: Home / Self Care | Payer: Medicare Other | Attending: Emergency Medicine | Admitting: Emergency Medicine

## 2017-10-22 DIAGNOSIS — Z7984 Long term (current) use of oral hypoglycemic drugs: Secondary | ICD-10-CM | POA: Diagnosis not present

## 2017-10-22 DIAGNOSIS — E119 Type 2 diabetes mellitus without complications: Secondary | ICD-10-CM | POA: Diagnosis not present

## 2017-10-22 DIAGNOSIS — I509 Heart failure, unspecified: Secondary | ICD-10-CM | POA: Insufficient documentation

## 2017-10-22 DIAGNOSIS — I10 Essential (primary) hypertension: Secondary | ICD-10-CM | POA: Insufficient documentation

## 2017-10-22 DIAGNOSIS — Z79899 Other long term (current) drug therapy: Secondary | ICD-10-CM | POA: Insufficient documentation

## 2017-10-22 DIAGNOSIS — Z853 Personal history of malignant neoplasm of breast: Secondary | ICD-10-CM | POA: Insufficient documentation

## 2017-10-22 DIAGNOSIS — R0602 Shortness of breath: Secondary | ICD-10-CM | POA: Diagnosis present

## 2017-10-22 LAB — COMPREHENSIVE METABOLIC PANEL
ALBUMIN: 4 g/dL (ref 3.5–5.0)
ALT: 26 U/L (ref 14–54)
AST: 34 U/L (ref 15–41)
Alkaline Phosphatase: 103 U/L (ref 38–126)
Anion gap: 11 (ref 5–15)
BILIRUBIN TOTAL: 3.5 mg/dL — AB (ref 0.3–1.2)
BUN: 18 mg/dL (ref 6–20)
CHLORIDE: 104 mmol/L (ref 101–111)
CO2: 21 mmol/L — ABNORMAL LOW (ref 22–32)
CREATININE: 0.65 mg/dL (ref 0.44–1.00)
Calcium: 9 mg/dL (ref 8.9–10.3)
GFR calc Af Amer: >60 mL/min (ref >60)
GFR calc non Af Amer: >60 mL/min (ref >60)
GLUCOSE: 111 mg/dL — AB (ref 65–99)
Potassium: 4.2 mmol/L (ref 3.5–5.1)
Sodium: 136 mmol/L (ref 135–145)
Total Protein: 7.4 g/dL (ref 6.5–8.1)

## 2017-10-22 LAB — BRAIN NATRIURETIC PEPTIDE: B Natriuretic Peptide: 340 pg/mL — ABNORMAL HIGH (ref 0.0–100.0)

## 2017-10-22 LAB — CBC WITH DIFFERENTIAL/PLATELET
Basophils Absolute: 0.1 10*3/uL (ref 0–0.1)
Basophils Relative: 0 %
Eosinophils Absolute: 0.1 10*3/uL (ref 0–0.7)
Eosinophils Relative: 1 %
HEMATOCRIT: 39.9 % (ref 35.0–47.0)
HEMOGLOBIN: 13.4 g/dL (ref 12.0–16.0)
LYMPHS ABS: 1.2 10*3/uL (ref 1.0–3.6)
Lymphocytes Relative: 8 %
MCH: 26.9 pg (ref 26.0–34.0)
MCHC: 33.6 g/dL (ref 32.0–36.0)
MCV: 80.1 fL (ref 80.0–100.0)
MONO ABS: 1.3 10*3/uL — AB (ref 0.2–0.9)
MONOS PCT: 10 %
NEUTROS ABS: 11.4 10*3/uL — AB (ref 1.4–6.5)
NEUTROS PCT: 81 %
Platelets: 203 10*3/uL (ref 150–440)
RBC: 4.99 MIL/uL (ref 3.80–5.20)
RDW: 15.6 % — AB (ref 11.5–14.5)
WBC: 14 10*3/uL — ABNORMAL HIGH (ref 3.6–11.0)

## 2017-10-22 LAB — TROPONIN I: Troponin I: <0.03 ng/mL (ref <0.03)

## 2017-10-22 MED ORDER — FUROSEMIDE 10 MG/ML IJ SOLN
20.0000 mg | Freq: Once | INTRAMUSCULAR | Status: AC
  Administered 2017-10-22: 20 mg via INTRAVENOUS
  Filled 2017-10-22: qty 4

## 2017-10-22 MED ORDER — FUROSEMIDE 20 MG PO TABS: 20.0000 mg | ORAL_TABLET | ORAL | 0 refills | Status: AC

## 2017-10-22 NOTE — Discharge Instructions (Addendum)
These return at once for any further shortness of breath, any chest tightness or heaviness or if you get lightheaded and cannot get up without problems.  Please take the Lasix 1 every other day starting tomorrow.  Please follow-up with your doctor in the next few days.

## 2017-10-22 NOTE — ED Triage Notes (Signed)
Pt family reports pt stopped taking her BO meds with a diuertic in it awhile back and thinks this may be fluid.

## 2017-10-22 NOTE — ED Provider Notes (Signed)
[facility] Emergency Department Provider Note   ____________________________________________   First MD Initiated Contact with Patient 10/22/17 0802     (approximate)  I have reviewed the triage vital signs and the nursing notes.   HISTORY  Chief Complaint Shortness of Breath    HPI Isabella Cochran is a 84 y.o. female who reported she started having some shortness of breath yesterday.  She said she vomited once this morning.  She gets short of breath laying down with exertion.  Is been going on for about a day or 2.  She states she used to take an ACE inhibitor with HCTZ but stopped it sometime ago.  She denies any chest pain or tightness fever coughing or any other complaints she says she is somewhat short of breath.   Past Medical History:  Diagnosis Date  . Arthritis   . Breast cancer ([place]) 2015- right   invasive mammary carcinoma--ER/PR+, Her2neu- radiation  . Chronic back pain   . Chronic back pain   . Diabetes mellitus without complication ([place])   . Gout   . Hypertension   . Osteoarthritis   . Personal history of radiation therapy 2015   invasive mammary carcinoma     Patient Active Problem List   Diagnosis Date Noted  . Carcinoma of overlapping sites of right breast in female, estrogen receptor positive ([place]) 11/24/2016  . Spinal stenosis, lumbar region, with neurogenic claudication 10/04/2014  . DDD (degenerative disc disease), lumbosacral 09/06/2014  . Lumbar radiculopathy 09/06/2014  . Spinal stenosis of lumbar region 09/06/2014    Past Surgical History:  Procedure Laterality Date  . ABDOMINAL HYSTERECTOMY    . APPENDECTOMY    . BREAST BIOPSY Right 2015   +  . BREAST BIOPSY Bilateral    neg  . BREAST LUMPECTOMY Right 2015   F/U radiation   . CHOLECYSTECTOMY    . KNEE ARTHROSCOPY Left     Prior to Admission medications   Medication Sig Start Date End Date Taking? Authorizing Provider  allopurinol (ZYLOPRIM) 100 MG tablet  Take 100 mg by mouth at bedtime.    [provider]  anastrozole (ARIMIDEX) 1 MG tablet Take 1 tablet (1 mg total) by mouth daily. 11/24/16   Cammie Sickle, MD  atenolol (TENORMIN) 100 MG tablet Take 100 mg by mouth daily.    [provider]  Calcium Carb-Cholecalciferol (CALCIUM 600 + D PO) Take 2 tablets by mouth daily.     [provider]  celecoxib (CELEBREX) 200 MG capsule Take 200 mg by mouth at bedtime.    [provider]  clobetasol cream (TEMOVATE) 9.62 % Apply 1 application topically 2 (two) times daily.    [provider]  colchicine 0.6 MG tablet Take 0.6 mg by mouth as needed.     [provider]  FLUoxetine (PROZAC) 20 MG capsule Take 20 mg by mouth at bedtime.    [provider]  metFORMIN (GLUCOPHAGE) 500 MG tablet Take 500 mg by mouth at bedtime.    [provider]  NIFEdipine (PROCARDIA-XL/ADALAT-CC/NIFEDICAL-XL) 30 MG 24 hr tablet Take 30 mg by mouth at bedtime.    [provider]  oxybutynin (DITROPAN) 5 MG tablet Take 5 mg by mouth 2 (two) times daily.    [provider]    Allergies Lisinopril-hydrochlorothiazide; Aspirin; and Gabapentin  Family History  Problem Relation Age of Onset  . Liver cancer Mother        liver  . Heart  disease Mother   . Stroke Father   . Liver cancer Sister   . Breast cancer Paternal Aunt   . Breast cancer Paternal Aunt     Social History Social History   Tobacco Use  . Smoking status: Never Smoker  . Smokeless tobacco: Never Used  Substance Use Topics  . Alcohol use: No    Alcohol/week: 0.0 oz  . Drug use: No    Review of Systems  Constitutional: No fever/chills Eyes: No visual changes. ENT: No sore throat. Cardiovascular: Denies chest pain. Respiratory:  shortness of breath. Gastrointestinal: No abdominal pain.  No nausea, no vomiting.  No diarrhea.  No constipation. Genitourinary: Negative for dysuria. Musculoskeletal:  Negative for back pain. Skin: Negative for rash. Neurological: Negative for headaches, focal weakness   ____________________________________________   PHYSICAL EXAM:  VITAL SIGNS: ED Triage Vitals  Enc Vitals Group     BP 10/22/17 0751 (!) 109/94     Pulse Rate 10/22/17 0751 72     Resp 10/22/17 0751 (!) 22     Temp 10/22/17 0751 98.3 F (36.8 C)     Temp Source 10/22/17 0751 Oral     SpO2 10/22/17 0751 90 %     Weight 10/22/17 0751 160 lb (72.6 kg)     Height 10/22/17 0751 5' (1.524 m)     Head Circumference --      Peak Flow --      Pain Score 10/22/17 0745 0     Pain Loc --      Pain Edu? --      Excl. in [place]? --    Constitutional: Alert and oriented. Well appearing and in no acute distress. Eyes: Conjunctivae are normal. Head: Atraumatic. Nose: No congestion/rhinnorhea. Mouth/Throat: Mucous membranes are moist.  Oropharynx non-erythematous. Neck: No stridor.   Cardiovascular: Normal rate, regular rhythm. Grossly normal heart sounds.  Good peripheral circulation. Respiratory: Normal respiratory effort.  No retractions. Lungs scattered crackles in bases only Gastrointestinal: Soft and nontender. No distention. No abdominal bruits. No CVA tenderness. Musculoskeletal: No lower extremity tenderness trace edema.  No joint effusions. Neurologic:  Normal speech and language. No gross focal neurologic deficits are appreciated. Skin:  Skin is warm, dry and intact. No rash noted. Psychiatric: Mood and affect are normal. Speech and behavior are normal.  ____________________________________________   LABS (all labs ordered are listed, but only abnormal results are displayed)  Labs Reviewed  COMPREHENSIVE METABOLIC PANEL - Abnormal; Notable for the following components:      Result Value   CO2 21 (*)    Glucose, Bld 111 (*)    Total Bilirubin 3.5 (*)    All other components within normal limits  BRAIN NATRIURETIC PEPTIDE - Abnormal; Notable for the following components:    B Natriuretic Peptide 340.0 (*)    All other components within normal limits  CBC WITH DIFFERENTIAL/PLATELET - Abnormal; Notable for the following components:   WBC 14.0 (*)    RDW 15.6 (*)    Neutro Abs 11.4 (*)    Monocytes Absolute 1.3 (*)    All other components within normal limits  TROPONIN I   ____________________________________________  EKG  EKG read and interpreted by me shows a regular rhythm with a few irregular beats rate of 81 normal axis no acute ST-T wave changes this regular rhythm may be first-degree AV block it may be junctional difficult to determine. ____________________________________________  RADIOLOGY  ED MD interpretation: Chest x-[place] read by radiology reviewed by me shows mild  pulmonary edema and CHF.  Official radiology report(s): Dg Chest Portable 1 View  Result Date: 10/22/2017 CLINICAL DATA:  84 year old female with shortness of breath for several days. Initial encounter. EXAM: PORTABLE CHEST 1 VIEW COMPARISON:  01/10/2016 chest x-[place]. FINDINGS: Mild pulmonary edema with small right-sided pleural effusion. Heart top-normal to slightly enlarged. Calcified mildly tortuous aorta. No acute osseous abnormality. IMPRESSION: Mild pulmonary edema with small right-sided pleural effusion. Heart top-normal to slightly enlarged. Aortic Atherosclerosis (ICD10-I70.0). Electronically Signed   By: Genia Del M.D.   On: 10/22/2017 08:23    ____________________________________________   PROCEDURES  Procedure(s) performed: Patient given some IV Lasix in the ER Procedures  Critical Care performed:   ____________________________________________   INITIAL IMPRESSION / ASSESSMENT AND PLAN / ED COURSE  Patient feeling better she does get a little bit woozy when she sits up fast but only a little bit.  Her shortness of breath is much better.  Her daughter will stay with her now and someone in the family will stay with her tonight.  She will follow-up with her doctor  in the next few days.         ____________________________________________   FINAL CLINICAL IMPRESSION(S) / ED DIAGNOSES  Final diagnoses:  Congestive heart failure, unspecified HF chronicity, unspecified heart failure type [facility])     ED Discharge Orders    None       Note:  This document was prepared using Dragon voice recognition software and may include unintentional dictation errors.    Nena Polio, MD 10/22/17 1115

## 2017-10-22 NOTE — ED Triage Notes (Signed)
Pt reports yesterday started with se=omr shortness of breath. Pt also vomited x's 1 this am. Denies pain.

## 2018-01-07 ENCOUNTER — Inpatient Hospital Stay: Payer: Medicare Other | Admitting: Internal Medicine

## 2018-01-07 ENCOUNTER — Inpatient Hospital Stay: Payer: Medicare Other

## 2018-01-11 ENCOUNTER — Other Ambulatory Visit: Payer: Self-pay | Admitting: Internal Medicine

## 2018-01-11 DIAGNOSIS — C50811 Malignant neoplasm of overlapping sites of right female breast: Secondary | ICD-10-CM

## 2018-01-11 DIAGNOSIS — Z170 Estrogen receptor positive status [ER+]: Secondary | ICD-10-CM

## 2018-01-11 DIAGNOSIS — D0511 Intraductal carcinoma in situ of right breast: Secondary | ICD-10-CM

## 2018-01-27 ENCOUNTER — Other Ambulatory Visit: Payer: Self-pay | Admitting: Internal Medicine

## 2018-01-27 DIAGNOSIS — C50811 Malignant neoplasm of overlapping sites of right female breast: Secondary | ICD-10-CM

## 2018-01-27 DIAGNOSIS — D0511 Intraductal carcinoma in situ of right breast: Secondary | ICD-10-CM

## 2018-01-27 DIAGNOSIS — Z170 Estrogen receptor positive status [ER+]: Secondary | ICD-10-CM

## 2018-02-10 ENCOUNTER — Other Ambulatory Visit: Payer: Self-pay | Admitting: Internal Medicine

## 2018-02-10 DIAGNOSIS — C50811 Malignant neoplasm of overlapping sites of right female breast: Secondary | ICD-10-CM

## 2018-02-10 DIAGNOSIS — Z170 Estrogen receptor positive status [ER+]: Secondary | ICD-10-CM

## 2018-02-10 DIAGNOSIS — D0511 Intraductal carcinoma in situ of right breast: Secondary | ICD-10-CM

## 2018-03-19 ENCOUNTER — Ambulatory Visit
Admission: RE | Admit: 2018-03-19 | Discharge: 2018-03-19 | Disposition: A | Discharge status: Home / Self Care | Payer: Medicare Other | Source: Ambulatory Visit | Attending: Internal Medicine | Admitting: Internal Medicine

## 2018-03-19 ENCOUNTER — Other Ambulatory Visit: Payer: Self-pay | Admitting: Internal Medicine

## 2018-03-19 DIAGNOSIS — I824Y9 Acute embolism and thrombosis of unspecified deep veins of unspecified proximal lower extremity: Secondary | ICD-10-CM | POA: Diagnosis present

## 2018-03-19 DIAGNOSIS — M7989 Other specified soft tissue disorders: Secondary | ICD-10-CM | POA: Diagnosis not present

## 2018-03-19 DIAGNOSIS — M79604 Pain in right leg: Secondary | ICD-10-CM | POA: Insufficient documentation

## 2018-06-13 ENCOUNTER — Other Ambulatory Visit: Payer: Self-pay

## 2018-06-13 ENCOUNTER — Emergency Department
Admission: EM | Admit: 2018-06-13 | Discharge: 2018-06-30 | Disposition: E | Payer: Medicare Other | Attending: Emergency Medicine | Admitting: Emergency Medicine

## 2018-06-13 ENCOUNTER — Emergency Department: Payer: Medicare Other

## 2018-06-13 DIAGNOSIS — I10 Essential (primary) hypertension: Secondary | ICD-10-CM | POA: Insufficient documentation

## 2018-06-13 DIAGNOSIS — Z853 Personal history of malignant neoplasm of breast: Secondary | ICD-10-CM | POA: Insufficient documentation

## 2018-06-13 DIAGNOSIS — E119 Type 2 diabetes mellitus without complications: Secondary | ICD-10-CM | POA: Insufficient documentation

## 2018-06-13 DIAGNOSIS — I469 Cardiac arrest, cause unspecified: Secondary | ICD-10-CM | POA: Insufficient documentation

## 2018-06-13 DIAGNOSIS — Z79899 Other long term (current) drug therapy: Secondary | ICD-10-CM | POA: Insufficient documentation

## 2018-06-13 DIAGNOSIS — R0789 Other chest pain: Secondary | ICD-10-CM | POA: Diagnosis present

## 2018-06-13 LAB — CBC
HEMATOCRIT: 46.7 % — AB (ref 36.0–46.0)
Hemoglobin: 14.6 g/dL (ref 12.0–15.0)
MCH: 26.2 pg (ref 26.0–34.0)
MCHC: 31.3 g/dL (ref 30.0–36.0)
MCV: 83.7 fL (ref 80.0–100.0)
PLATELETS: 193 10*3/uL (ref 150–400)
RBC: 5.58 MIL/uL — AB (ref 3.87–5.11)
RDW: 15.5 % (ref 11.5–15.5)
WBC: 14 10*3/uL — ABNORMAL HIGH (ref 4.0–10.5)
nRBC: 0 % (ref 0.0–0.2)

## 2018-06-13 LAB — BASIC METABOLIC PANEL
ANION GAP: 10 (ref 5–15)
BUN: 20 mg/dL (ref 8–23)
CO2: 23 mmol/L (ref 22–32)
CREATININE: 1.04 mg/dL — AB (ref 0.44–1.00)
Calcium: 9.2 mg/dL (ref 8.9–10.3)
Chloride: 104 mmol/L (ref 98–111)
GFR calc Af Amer: 57 mL/min — ABNORMAL LOW (ref >60)
GFR calc non Af Amer: 49 mL/min — ABNORMAL LOW (ref >60)
GLUCOSE: 147 mg/dL — AB (ref 70–99)
Potassium: 4.3 mmol/L (ref 3.5–5.1)
Sodium: 137 mmol/L (ref 135–145)

## 2018-06-13 LAB — TROPONIN I: Troponin I: 0.98 ng/mL (ref <0.03)

## 2018-06-13 MED ORDER — EPINEPHRINE PF 1 MG/10ML IJ SOSY
PREFILLED_SYRINGE | INTRAMUSCULAR | Status: AC | PRN
  Administered 2018-06-13 (×3): 1 via INTRAVENOUS

## 2018-06-13 MED ORDER — EPINEPHRINE PF 1 MG/10ML IJ SOSY
PREFILLED_SYRINGE | INTRAMUSCULAR | Status: AC | PRN
  Administered 2018-06-13 (×2): 1 via INTRAVENOUS

## 2018-06-13 MED ORDER — ROCURONIUM BROMIDE 50 MG/5ML IV SOLN
INTRAVENOUS | Status: AC | PRN
  Administered 2018-06-13: 30 mg via INTRAVENOUS

## 2018-06-13 MED ORDER — EPINEPHRINE PF 1 MG/10ML IJ SOSY
PREFILLED_SYRINGE | INTRAMUSCULAR | Status: AC | PRN
  Administered 2018-06-13 (×6): 1 via INTRAVENOUS

## 2018-06-13 MED ORDER — SODIUM CHLORIDE 0.9 % IV SOLN
INTRAVENOUS | Status: AC | PRN
  Administered 2018-06-13: 1000 mL via INTRAVENOUS

## 2018-06-13 MED ORDER — ETOMIDATE 2 MG/ML IV SOLN
INTRAVENOUS | Status: AC | PRN
  Administered 2018-06-13: 30 mg via INTRAVENOUS

## 2018-06-13 MED ORDER — EPINEPHRINE PF 1 MG/10ML IJ SOSY
PREFILLED_SYRINGE | INTRAMUSCULAR | Status: AC | PRN
  Administered 2018-06-13: 1 via INTRAVENOUS

## 2018-06-13 MED ORDER — CALCIUM CHLORIDE 10 % IV SOLN
INTRAVENOUS | Status: AC | PRN
  Administered 2018-06-13: 1 g via INTRAVENOUS

## 2018-06-13 MED ORDER — SODIUM BICARBONATE 8.4 % IV SOLN
INTRAVENOUS | Status: AC | PRN
  Administered 2018-06-13: 50 meq via INTRAVENOUS

## 2018-06-15 MED FILL — Medication: Qty: 1 | Status: AC

## 2018-06-30 NOTE — Code Documentation (Signed)
Pulse check. No pulse. Resume compressions

## 2018-06-30 NOTE — Code Documentation (Signed)
Family speaking with MD Archie Balboa still at this time. Family going back to family wait at this time

## 2018-06-30 NOTE — Code Documentation (Signed)
Patients blue purse given to daughter

## 2018-06-30 NOTE — Code Documentation (Signed)
Pulse check. Resuming compressions

## 2018-06-30 NOTE — ED Notes (Signed)
Lab called to inquire about STAT add on troponin

## 2018-06-30 NOTE — Code Documentation (Addendum)
Pulse check.  pulse found. Intubating at this time

## 2018-06-30 NOTE — Code Documentation (Signed)
MD goodman, explaining patients condition to family now

## 2018-06-30 NOTE — Code Documentation (Signed)
Pulse check. Faint pulse found by MD goodman. Repeat EKG being completed at this time

## 2018-06-30 NOTE — ED Notes (Signed)
[facility] contacted per this RN, Bethena Roys is representative that I spoke with.  Referral number for this case is [id number].

## 2018-06-30 NOTE — Code Documentation (Signed)
Pulse check at this time. No pulse. Resuming compressions

## 2018-06-30 NOTE — ED Notes (Signed)
Pt placed in body bag at this time. Orderly at bedside to take pt to morgue.

## 2018-06-30 NOTE — ED Triage Notes (Addendum)
Nausea and chest pain that began at 12 pm today. Centralized chest pain, heaviness. SOB since CP began.  Pt reports eating at [facility] and feeling nauseated about 12PM today. Pt alert and oriented X4, active, cooperative, pt in NAD. RR even and unlabored, color WNL.

## 2018-06-30 NOTE — Code Documentation (Signed)
CPR resumed at this time  

## 2018-06-30 NOTE — ED Notes (Signed)
Date and time results received: 07/04/2018 1818 (use smartphrase ".now" to insert current time)  Test: Troponin Critical Value: 0.98  Name of Provider Notified: Dr. Archie Balboa  Orders Received? Or Actions Taken?: Orders Received - See Orders for details

## 2018-06-30 NOTE — Code Documentation (Addendum)
Family asking [place] to stop compressions at this time. Compressions stopped.  Time of death noted at this time per EDP, Archie Balboa.

## 2018-06-30 NOTE — Code Documentation (Signed)
Pulse check no pulse cpr continued

## 2018-06-30 NOTE — Code Documentation (Signed)
Compressions resumed. No pulse

## 2018-06-30 NOTE — Code Documentation (Signed)
Lab contacted to run troponin

## 2018-06-30 NOTE — ED Notes (Signed)
Family at bedside. This RN received information from family in regards to funeral home wishes. Funeral home information placed in pt chart.

## 2018-06-30 NOTE — Progress Notes (Signed)
   05-Jul-2018 2000  Clinical Encounter Type  Visited With Family;Patient and family together  Visit Type Initial;Spiritual support;Death;ED  Referral From Nurse  Spiritual Encounters  Spiritual Needs Emotional;Grief support  Stress Factors  Family Stress Factors Exhausted;Loss;Major life changes

## 2018-06-30 NOTE — Code Documentation (Signed)
Family at bedside at this time. Pulse check. No pulse found. Resume compressions

## 2018-06-30 NOTE — Code Documentation (Signed)
Pulse check at this time. Resuming compressions

## 2018-06-30 NOTE — ED Notes (Signed)
Pts son called out from lobby stating his mother was "passing out". Pt was quickly assessed and noted to be cyanotic and apneic. Charge RN notified and pt taken to room 14. Pulse check revealed no pulse- CPR initiated by this RN.

## 2018-06-30 NOTE — Code Documentation (Signed)
CPR started at this time.

## 2018-06-30 NOTE — ED Provider Notes (Signed)
[facility] Emergency Department Provider Note    ____________________________________________   I have reviewed the triage vital signs and the nursing notes.   HISTORY  Chief Complaint Chest Pain   History limited by and level 5 caveat due to unresponsiveness   HPI Isabella Cochran is a 85 y.o. female who presents to the emergency department today according to the triage note because of concerns for nausea and centralized chest pain and heaviness.  However by the time I evaluated the patient she was no longer responsive. She had been out in triage and went unresponsive. She was having agonal breathing and was pulseless on my initial assessment.   Per medical record review patient has a history of HTN, DM.   Past Medical History:  Diagnosis Date  . Arthritis   . Breast cancer ([place]) 2015- right   invasive mammary carcinoma--ER/PR+, Her2neu- radiation  . Chronic back pain   . Chronic back pain   . Diabetes mellitus without complication ([place])   . Gout   . Hypertension   . Osteoarthritis   . Personal history of radiation therapy 2015   invasive mammary carcinoma     Patient Active Problem List   Diagnosis Date Noted  . Carcinoma of overlapping sites of right breast in female, estrogen receptor positive ([place]) 11/24/2016  . Spinal stenosis, lumbar region, with neurogenic claudication 10/04/2014  . DDD (degenerative disc disease), lumbosacral 09/06/2014  . Lumbar radiculopathy 09/06/2014  . Spinal stenosis of lumbar region 09/06/2014    Past Surgical History:  Procedure Laterality Date  . ABDOMINAL HYSTERECTOMY    . APPENDECTOMY    . BREAST BIOPSY Right 2015   +  . BREAST BIOPSY Bilateral    neg  . BREAST LUMPECTOMY Right 2015   F/U radiation   . CHOLECYSTECTOMY    . KNEE ARTHROSCOPY Left     Prior to Admission medications   Medication Sig Start Date End Date Taking? Authorizing Provider  allopurinol (ZYLOPRIM) 100 MG tablet Take 100 mg by  mouth at bedtime.    [provider]  anastrozole (ARIMIDEX) 1 MG tablet TAKE 1 TABLET BY MOUTH EVERY DAY 02/11/18   Cammie Sickle, MD  atenolol (TENORMIN) 100 MG tablet Take 100 mg by mouth daily.    [provider]  Calcium Carb-Cholecalciferol (CALCIUM 600 + D PO) Take 2 tablets by mouth daily.     [provider]  celecoxib (CELEBREX) 200 MG capsule Take 200 mg by mouth at bedtime.    [provider]  clobetasol cream (TEMOVATE) 4.40 % Apply 1 application topically 2 (two) times daily.    [provider]  colchicine 0.6 MG tablet Take 0.6 mg by mouth as needed.     [provider]  FLUoxetine (PROZAC) 20 MG capsule Take 20 mg by mouth at bedtime.    [provider]  furosemide (LASIX) 20 MG tablet Take 1 tablet (20 mg total) by mouth every other day. 10/22/17 10/22/18  Nena Polio, MD  metFORMIN (GLUCOPHAGE) 500 MG tablet Take 500 mg by mouth at bedtime.    [provider]  NIFEdipine (PROCARDIA-XL/ADALAT-CC/NIFEDICAL-XL) 30 MG 24 hr tablet Take 30 mg by mouth at bedtime.    [provider]  oxybutynin (DITROPAN) 5 MG tablet Take 5 mg by mouth 2 (two) times daily.    [provider]    Allergies Lisinopril-hydrochlorothiazide; Aspirin; and Gabapentin  Family History  Problem Relation Age of Onset  . Liver cancer Mother  liver  . Heart disease Mother   . Stroke Father   . Liver cancer Sister   . Breast cancer Paternal Aunt   . Breast cancer Paternal Aunt     Social History Social History   Tobacco Use  . Smoking status: Never Smoker  . Smokeless tobacco: Never Used  Substance Use Topics  . Alcohol use: No    Alcohol/week: 0.0 standard drinks  . Drug use: No    Review of Systems Unable to obtain secondary to unresponsiveness.  ____________________________________________   PHYSICAL EXAM:  VITAL SIGNS: ED Triage Vitals  Enc Vitals Group     BP 07-11-18 1622 (!)  144/62     Pulse Rate 07-11-2018 1622 74     Resp 07-11-18 1622 (!) 22     Temp 2018/07/11 1622 97.8 F (36.6 C)     Temp Source July 11, 2018 1622 Oral     SpO2 07/11/18 1622 95 %     Weight 2018-07-11 1620 179 lb (81.2 kg)     Height 2018-07-11 1620 5' (1.524 m)     Head Circumference --      Peak Flow --      Pain Score Jul 11, 2018 1620 8   Constitutional: Unresponsive.  Eyes: Conjunctivae are normal.  ENT      Head: Normocephalic and atraumatic.      Nose: No congestion/rhinnorhea.      Mouth/Throat: Mucous membranes are moist.      Neck: No stridor. Hematological/Lymphatic/Immunilogical: No cervical lymphadenopathy. Cardiovascular: Pulseless.  Respiratory: Agonal breathing.  Gastrointestinal: Soft and non tender. No rebound. No guarding.  Genitourinary: Deferred Musculoskeletal: Normal range of motion in all extremities. No lower extremity edema. Neurologic:  Unresponsive.  Skin:  Slightly cyanotic.  ____________________________________________    LABS (pertinent positives/negatives)  Trop 0.98 CBC wbc 14.0, hgb 14.6, plt 193 BMP wnl except glu 147, cr 1.04  ____________________________________________   EKG  I, Nance Pear, attending physician, personally viewed and interpreted this EKG  EKG Time: 1619 Rate: 75 Rhythm: atrial fibrillation Axis: right axis deviation Intervals: qtc 444 QRS: narrow, q waves v1, v2 ST changes: no st elevation Impression: abnormal ekg   ____________________________________________    RADIOLOGY  CXR Chronic lung changes, without acute finding  ____________________________________________   PROCEDURES  Procedures  INTUBATION Performed by: Nance Pear  Required items: required blood products, implants, devices, and special equipment available  Indications: cardiopulmonary arrest  Intubation method: Glidescope   Preoxygenation: BVM  Sedatives: Etomidate Paralytic: Rocuronium   Tube Size: 7.5  cuffed  Post-procedure assessment: chest rise and ETCO2 monitor Breath sounds: equal and absent over the epigastrium Tube secured with: ETT holder  Patient tolerated the procedure well with no immediate complications.   CRITICAL CARE Performed by: Nance Pear   Total critical care time: 40 minutes  Critical care time was exclusive of separately billable procedures and treating other patients.  Critical care was necessary to treat or prevent imminent or life-threatening deterioration.  Critical care was time spent personally by me on the following activities: development of treatment plan with patient and/or surrogate as well as nursing, discussions with consultants, evaluation of patient's response to treatment, examination of patient, obtaining history from patient or surrogate, ordering and performing treatments and interventions, ordering and review of laboratory studies, ordering and review of radiographic studies, pulse oximetry and re-evaluation of patient's condition.  Cardiopulmonary Resuscitation (CPR) Procedure Note Directed/Performed by: Nance Pear I personally directed ancillary staff and/or performed CPR in an effort to regain return of spontaneous  circulation and to maintain cardiac, neuro and systemic perfusion.   ____________________________________________   INITIAL IMPRESSION / ASSESSMENT AND PLAN / ED COURSE  Pertinent labs & imaging results that were available during my care of the patient were reviewed by me and considered in my medical decision making (see chart for details).   Patient presented to the emergency department today because of concerns for chest pain and nausea.  Patient was brought emergently back into the main ER room after having unresponsive out in the lobby.  Upon my initial assessment patient was unresponsive pulseless with agonal breathing.  CPR was promptly initiated.  Multiple rounds of CPR were performed, please see nursing  documentation for exact times and medication administrations.  We did briefly get return of spontaneous circulation and at that time that the patient was intubated.  However pulses were lost again.  Multiple rounds of CPR were again performed. Did discuss with family and they were able to witness CPR. Discussed that given lack of significant response I had concern that further attempts would be futile. Family did request CPR to be stopped.    ____________________________________________   FINAL CLINICAL IMPRESSION(S) / ED DIAGNOSES  Final diagnoses:  Cardiopulmonary arrest [facility])     Note: This dictation was prepared with Dragon dictation. Any transcriptional errors that result from this process are unintentional     Nance Pear, MD 06/14/18 1844

## 2018-06-30 NOTE — Code Documentation (Signed)
rhythm check. Compressions resumed

## 2018-06-30 DEATH — deceased

## 2018-07-18 ENCOUNTER — Ambulatory Visit: Payer: Medicare Other | Admitting: Radiation Oncology
